# Patient Record
Sex: Male | Born: 1965 | Race: White | Hispanic: No | Marital: Single | Smoking: Never smoker
Health system: Southern US, Community
[De-identification: ages and names within clinical notes are randomized; demographics above are authoritative.]

## PROBLEM LIST (undated history)

## (undated) DIAGNOSIS — F329 Major depressive disorder, single episode, unspecified: Secondary | ICD-10-CM

## (undated) DIAGNOSIS — I10 Essential (primary) hypertension: Secondary | ICD-10-CM

## (undated) DIAGNOSIS — F32A Depression, unspecified: Secondary | ICD-10-CM

## (undated) DIAGNOSIS — R7989 Other specified abnormal findings of blood chemistry: Secondary | ICD-10-CM

## (undated) HISTORY — DX: Essential (primary) hypertension: I10

## (undated) HISTORY — DX: Depression, unspecified: F32.A

## (undated) HISTORY — DX: Major depressive disorder, single episode, unspecified: F32.9

## (undated) HISTORY — DX: Other specified abnormal findings of blood chemistry: R79.89

---

## 2012-05-25 DIAGNOSIS — E291 Testicular hypofunction: Secondary | ICD-10-CM | POA: Insufficient documentation

## 2012-05-25 DIAGNOSIS — Z79899 Other long term (current) drug therapy: Secondary | ICD-10-CM | POA: Insufficient documentation

## 2012-05-25 DIAGNOSIS — N529 Male erectile dysfunction, unspecified: Secondary | ICD-10-CM | POA: Insufficient documentation

## 2012-05-25 DIAGNOSIS — N486 Induration penis plastica: Secondary | ICD-10-CM | POA: Insufficient documentation

## 2012-05-25 DIAGNOSIS — N401 Enlarged prostate with lower urinary tract symptoms: Secondary | ICD-10-CM | POA: Insufficient documentation

## 2012-06-03 ENCOUNTER — Ambulatory Visit: Payer: Self-pay | Admitting: Internal Medicine

## 2012-06-03 LAB — CBC CANCER CENTER
Basophil %: 0.8 %
Eosinophil #: 0.2 x10 3/mm (ref 0.0–0.7)
Eosinophil %: 1.6 %
HGB: 18.3 g/dL — ABNORMAL HIGH (ref 13.0–18.0)
Lymphocyte #: 2.3 x10 3/mm (ref 1.0–3.6)
Lymphocyte %: 24.1 %
MCH: 32.1 pg (ref 26.0–34.0)
MCHC: 33.1 g/dL (ref 32.0–36.0)
Monocyte %: 10.1 %
Neutrophil #: 6.1 x10 3/mm (ref 1.4–6.5)
Neutrophil %: 63.4 %
Platelet: 252 x10 3/mm (ref 150–440)
RBC: 5.72 10*6/uL (ref 4.40–5.90)
RDW: 13.4 % (ref 11.5–14.5)
WBC: 9.6 x10 3/mm (ref 3.8–10.6)

## 2012-06-05 LAB — CANCER CENTER HEMATOCRIT: HCT: 55.6 % — ABNORMAL HIGH (ref 40.0–52.0)

## 2012-06-09 ENCOUNTER — Ambulatory Visit: Payer: Self-pay | Admitting: Internal Medicine

## 2012-06-10 LAB — CANCER CENTER HEMATOCRIT: HCT: 51.3 % (ref 40.0–52.0)

## 2012-06-16 LAB — CANCER CENTER HEMATOCRIT: HCT: 48.9 % (ref 40.0–52.0)

## 2012-06-29 LAB — CANCER CENTER HEMATOCRIT: HCT: 53.3 % — ABNORMAL HIGH (ref 40.0–52.0)

## 2012-07-03 LAB — CANCER CENTER HEMATOCRIT: HCT: 48 % (ref 40.0–52.0)

## 2012-07-06 LAB — CANCER CENTER HEMATOCRIT: HCT: 48.3 % (ref 40.0–52.0)

## 2012-07-10 ENCOUNTER — Ambulatory Visit: Payer: Self-pay | Admitting: Internal Medicine

## 2012-07-17 LAB — CANCER CTR PLATELET CT: Platelet: 293 x10 3/mm (ref 150–440)

## 2012-07-31 LAB — CANCER CENTER HEMATOCRIT: HCT: 49 % (ref 40.0–52.0)

## 2012-08-09 ENCOUNTER — Ambulatory Visit: Payer: Self-pay | Admitting: Internal Medicine

## 2012-09-09 ENCOUNTER — Ambulatory Visit: Payer: Self-pay | Admitting: Internal Medicine

## 2012-09-30 LAB — CANCER CENTER HEMATOCRIT: HCT: 48.5 % (ref 40.0–52.0)

## 2012-10-10 ENCOUNTER — Ambulatory Visit: Payer: Self-pay | Admitting: Internal Medicine

## 2012-11-04 LAB — CANCER CENTER HEMATOCRIT: HCT: 48.9 % (ref 40.0–52.0)

## 2012-11-07 ENCOUNTER — Ambulatory Visit: Payer: Self-pay | Admitting: Internal Medicine

## 2012-11-18 LAB — CANCER CENTER HEMATOCRIT: HCT: 51.2 % (ref 40.0–52.0)

## 2012-12-08 ENCOUNTER — Ambulatory Visit: Payer: Self-pay | Admitting: Internal Medicine

## 2012-12-16 LAB — CBC CANCER CENTER
Basophil #: 0.1 x10 3/mm (ref 0.0–0.1)
Basophil %: 1.4 %
Eosinophil #: 0.1 x10 3/mm (ref 0.0–0.7)
Eosinophil %: 2.3 %
HCT: 48 % (ref 40.0–52.0)
Lymphocyte #: 1.2 x10 3/mm (ref 1.0–3.6)
Lymphocyte %: 22.2 %
MCH: 30.2 pg (ref 26.0–34.0)
MCHC: 33.2 g/dL (ref 32.0–36.0)
MCV: 91 fL (ref 80–100)
Monocyte #: 0.7 x10 3/mm (ref 0.2–1.0)
Monocyte %: 12.2 %
Platelet: 266 x10 3/mm (ref 150–440)
RBC: 5.29 10*6/uL (ref 4.40–5.90)
RDW: 15.8 % — ABNORMAL HIGH (ref 11.5–14.5)

## 2012-12-23 LAB — CANCER CENTER HEMATOCRIT: HCT: 49.1 % (ref 40.0–52.0)

## 2013-01-07 ENCOUNTER — Ambulatory Visit: Payer: Self-pay | Admitting: Internal Medicine

## 2013-01-29 LAB — CANCER CENTER HEMATOCRIT: HCT: 46.2 % (ref 40.0–52.0)

## 2013-02-07 ENCOUNTER — Ambulatory Visit: Payer: Self-pay | Admitting: Internal Medicine

## 2013-02-12 LAB — CANCER CENTER HEMATOCRIT: HCT: 47.7 % (ref 40.0–52.0)

## 2013-02-26 LAB — CBC CANCER CENTER
Basophil %: 1.1 %
Eosinophil %: 3.4 %
HCT: 48.6 % (ref 40.0–52.0)
Lymphocyte #: 2.1 x10 3/mm (ref 1.0–3.6)
MCH: 30.2 pg (ref 26.0–34.0)
Monocyte #: 1 x10 3/mm (ref 0.2–1.0)
Monocyte %: 14.1 %
Neutrophil %: 50.6 %
Platelet: 295 x10 3/mm (ref 150–440)
RDW: 13.7 % (ref 11.5–14.5)

## 2013-03-09 ENCOUNTER — Ambulatory Visit: Payer: Self-pay | Admitting: Internal Medicine

## 2013-03-19 LAB — CANCER CENTER HEMATOCRIT: HCT: 47.9 % (ref 40.0–52.0)

## 2013-03-26 LAB — CANCER CENTER HEMATOCRIT: HCT: 47 % (ref 40.0–52.0)

## 2013-04-08 LAB — CANCER CENTER HEMATOCRIT: HCT: 46.3 % (ref 40.0–52.0)

## 2013-04-09 ENCOUNTER — Ambulatory Visit: Payer: Self-pay | Admitting: Internal Medicine

## 2013-05-10 ENCOUNTER — Ambulatory Visit: Payer: Self-pay | Admitting: Internal Medicine

## 2013-05-27 LAB — CBC CANCER CENTER
Basophil #: 0.1 x10 3/mm (ref 0.0–0.1)
Basophil %: 0.9 %
Eosinophil #: 0.2 x10 3/mm (ref 0.0–0.7)
Eosinophil %: 2.5 %
HCT: 50.7 % (ref 40.0–52.0)
Lymphocyte #: 1.5 x10 3/mm (ref 1.0–3.6)
Lymphocyte %: 22.7 %
MCH: 28.5 pg (ref 26.0–34.0)
MCHC: 32.8 g/dL (ref 32.0–36.0)
MCV: 87 fL (ref 80–100)
Monocyte #: 0.8 x10 3/mm (ref 0.2–1.0)
Neutrophil #: 4.2 x10 3/mm (ref 1.4–6.5)
Neutrophil %: 61.7 %
RBC: 5.83 10*6/uL (ref 4.40–5.90)
WBC: 6.8 x10 3/mm (ref 3.8–10.6)

## 2013-06-09 ENCOUNTER — Ambulatory Visit: Payer: Self-pay | Admitting: Internal Medicine

## 2013-07-10 ENCOUNTER — Ambulatory Visit: Payer: Self-pay | Admitting: Internal Medicine

## 2013-07-19 DIAGNOSIS — D751 Secondary polycythemia: Secondary | ICD-10-CM | POA: Insufficient documentation

## 2013-07-19 DIAGNOSIS — D45 Polycythemia vera: Secondary | ICD-10-CM | POA: Insufficient documentation

## 2013-08-12 ENCOUNTER — Ambulatory Visit: Payer: Self-pay | Admitting: Internal Medicine

## 2013-08-12 LAB — CBC CANCER CENTER
Basophil %: 1 %
Eosinophil #: 0.2 x10 3/mm (ref 0.0–0.7)
Eosinophil %: 3.5 %
HCT: 50.1 % (ref 40.0–52.0)
Lymphocyte #: 1.5 x10 3/mm (ref 1.0–3.6)
MCH: 29.2 pg (ref 26.0–34.0)
MCHC: 32.8 g/dL (ref 32.0–36.0)
MCV: 89 fL (ref 80–100)
Monocyte #: 0.7 x10 3/mm (ref 0.2–1.0)
Monocyte %: 12.7 %
Neutrophil #: 2.8 x10 3/mm (ref 1.4–6.5)
Neutrophil %: 54.1 %
Platelet: 252 x10 3/mm (ref 150–440)
RBC: 5.64 10*6/uL (ref 4.40–5.90)
RDW: 14.9 % — ABNORMAL HIGH (ref 11.5–14.5)
WBC: 5.2 x10 3/mm (ref 3.8–10.6)

## 2013-09-07 DIAGNOSIS — R972 Elevated prostate specific antigen [PSA]: Secondary | ICD-10-CM | POA: Insufficient documentation

## 2013-09-09 ENCOUNTER — Ambulatory Visit: Payer: Self-pay | Admitting: Internal Medicine

## 2013-09-24 ENCOUNTER — Emergency Department: Payer: Self-pay | Admitting: Emergency Medicine

## 2013-09-24 LAB — CBC
HCT: 49.4 % (ref 40.0–52.0)
HGB: 16.7 g/dL (ref 13.0–18.0)
MCH: 29.9 pg (ref 26.0–34.0)
MCHC: 33.8 g/dL (ref 32.0–36.0)
MCV: 88 fL (ref 80–100)
Platelet: 267 10*3/uL (ref 150–440)
RBC: 5.58 10*6/uL (ref 4.40–5.90)
RDW: 15.5 % — ABNORMAL HIGH (ref 11.5–14.5)
WBC: 6.9 10*3/uL (ref 3.8–10.6)

## 2013-09-24 LAB — BASIC METABOLIC PANEL
Anion Gap: 5 — ABNORMAL LOW (ref 7–16)
BUN: 14 mg/dL (ref 7–18)
CHLORIDE: 102 mmol/L (ref 98–107)
Calcium, Total: 8.7 mg/dL (ref 8.5–10.1)
Co2: 27 mmol/L (ref 21–32)
Creatinine: 1.05 mg/dL (ref 0.60–1.30)
Glucose: 109 mg/dL — ABNORMAL HIGH (ref 65–99)
OSMOLALITY: 269 (ref 275–301)
Potassium: 3.8 mmol/L (ref 3.5–5.1)
Sodium: 134 mmol/L — ABNORMAL LOW (ref 136–145)

## 2013-09-24 LAB — TROPONIN I
Troponin-I: <0.02 ng/mL
Troponin-I: <0.02 ng/mL

## 2014-03-01 DIAGNOSIS — F339 Major depressive disorder, recurrent, unspecified: Secondary | ICD-10-CM | POA: Insufficient documentation

## 2014-03-01 DIAGNOSIS — F32A Depression, unspecified: Secondary | ICD-10-CM | POA: Insufficient documentation

## 2014-03-01 DIAGNOSIS — E291 Testicular hypofunction: Secondary | ICD-10-CM | POA: Insufficient documentation

## 2014-03-01 DIAGNOSIS — D45 Polycythemia vera: Secondary | ICD-10-CM | POA: Insufficient documentation

## 2014-03-01 DIAGNOSIS — I10 Essential (primary) hypertension: Secondary | ICD-10-CM | POA: Insufficient documentation

## 2014-03-01 DIAGNOSIS — F329 Major depressive disorder, single episode, unspecified: Secondary | ICD-10-CM

## 2014-03-01 DIAGNOSIS — E785 Hyperlipidemia, unspecified: Secondary | ICD-10-CM | POA: Insufficient documentation

## 2016-05-30 DIAGNOSIS — Z800 Family history of malignant neoplasm of digestive organs: Secondary | ICD-10-CM | POA: Insufficient documentation

## 2017-04-30 ENCOUNTER — Ambulatory Visit (INDEPENDENT_AMBULATORY_CARE_PROVIDER_SITE_OTHER): Payer: Self-pay

## 2017-04-30 ENCOUNTER — Encounter: Payer: Self-pay | Admitting: Podiatry

## 2017-04-30 ENCOUNTER — Ambulatory Visit (INDEPENDENT_AMBULATORY_CARE_PROVIDER_SITE_OTHER): Payer: Self-pay | Admitting: Podiatry

## 2017-04-30 VITALS — BP 155/108 | HR 74 | Temp 98.7°F | Resp 16

## 2017-04-30 DIAGNOSIS — M722 Plantar fascial fibromatosis: Secondary | ICD-10-CM

## 2017-04-30 MED ORDER — METHYLPREDNISOLONE 4 MG PO TBPK: ORAL_TABLET | ORAL | 0 refills | Status: DC

## 2017-04-30 MED ORDER — MELOXICAM 15 MG PO TABS: 15.0000 mg | ORAL_TABLET | Freq: Every day | ORAL | 3 refills | Status: DC

## 2017-04-30 NOTE — Progress Notes (Signed)
   Subjective:    Patient ID: Mark Parsons., male    DOB: July 27, 1966, 51 y.o.   MRN: [id number]  HPI: He presents today chief complaint of pain to his left heel times the past 5 months. He reports that he stepped on a rock and it felt like stone bruise 5 or 6 months ago. He states now it is painful to walk and seems to have gotten worse. He states gets up in the morning just the worst pain of the day. He states this seems to come and go but recently has gotten worse. He tried over-the-counter inserts with his new tennis shoes to no avail.    Review of Systems  Musculoskeletal: Positive for arthralgias and gait problem.  All other systems reviewed and are negative.      Objective:   Physical Exam: Vital signs are stable alert and oriented 3. Pulses are palpable. Neurologic sensorium is intact. Deep tendon reflexes are intact. Muscle strength +5 over 5 dorsiflexes plantar flexors and inverters everters all just musculature is intact. Orthopedic evaluation demonstrates all joints distal to the ankle in full range of motion without crepitation. Cutaneous evaluation of a straight supple well-hydrated cutis he has pain on palpation medial calcaneal tubercle of the left heel. Otherwise joints distal to the ankle in full range of motion without crepitation. U Cheney's evaluation does not demonstrate any type of open lesions. Radiographs taken today demonstrate soft tissue increase in density at the plantar fascia continue insertion site. No other osseous abnormalities are noted.        Assessment & Plan:  Assessment: Plantar fasciitis left.  Plan: Injected the left heel today with Kenalog and local anesthetic starting him on a Medrol Dosepak to be followed by meloxicam. Also recommended the use of a plantar fascial brace and night splint. Discussed appropriate shoe gear stretching exercises ice therapy and shoe gear modifications. I will follow-up with him in 1 month.

## 2017-05-28 ENCOUNTER — Ambulatory Visit: Payer: Self-pay | Admitting: Podiatry

## 2017-09-15 NOTE — Progress Notes (Signed)
Hematology/Oncology Consult note [facility] Telephone:([phone] [phone]   Patient Care Team: [place] Pink, MD as PCP - General (Family Medicine)  REFERRING PROVIDER: Richardo Hanks  Urology from [facility]:  Evaluation of polycythemia polycythemia.    HISTORY OF PRESENTING ILLNESS:  Mark Parsons. is a  52 y.o.  male with PMH listed below who was referred to me for evaluation of polycythemia. Patient has a history of testicular hypofunction (Peyronie's disease)for which he follows with Dr. Jacqlyn Larsen at [facility] urology. He has been on testosterone injections and utilize sildenafil as needed. He has high hemoglobin in the past and was considered to be related to testosterone injection. He has had therapeutic phlebotomy in the past at [facility] or at used oral clinic. He also donates to blood drive and. He was referred to me because his hemoglobin has been high and blood drivedoes not take his donation   patient reports he does not have insurance coverage and he's paying out of pocket. He prefers only the most necessary labs being done today. He defer or other workups until he obtains coverage.    Review of Systems  Constitutional: Negative for chills and fever.  HENT: Negative for hearing loss.   Eyes: Negative for blurred vision and photophobia.  Respiratory: Negative for cough.   Cardiovascular: Negative for chest pain and orthopnea.  Gastrointestinal: Negative for heartburn, nausea and vomiting.  Genitourinary: Negative for dysuria and urgency.  Musculoskeletal: Negative for myalgias.  Skin: Negative for rash.       Facial plethora. .   Neurological: Negative for dizziness and headaches.  Endo/Heme/Allergies: Does not bruise/bleed easily.  Psychiatric/Behavioral: Negative for depression.    MEDICAL HISTORY:  Past Medical History:  Diagnosis Date  . Depression   . Hypertension   . Low testosterone in male      SURGICAL HISTORY: History reviewed. No pertinent surgical history.  SOCIAL HISTORY: Social History   Socioeconomic History  . Marital status: Single    Spouse name: Not on file  . Number of children: Not on file  . Years of education: Not on file  . Highest education level: Not on file  Social Needs  . Financial resource strain: Not on file  . Food insecurity - worry: Not on file  . Food insecurity - inability: Not on file  . Transportation needs - medical: Not on file  . Transportation needs - non-medical: Not on file  Occupational History  . Not on file  Tobacco Use  . Smoking status: Never Smoker  . Smokeless tobacco: Never Used  Substance and Sexual Activity  . Alcohol use: Yes    Alcohol/week: 24.0 oz    Types: 40 Cans of beer per week  . Drug use: No  . Sexual activity: Not Currently  Other Topics Concern  . Not on file  Social History Narrative  . Not on file    FAMILY HISTORY: Family History  Problem Relation Age of Onset  . Colon cancer Mother   . Multiple myeloma Father   . Aortic aneurysm Father   . Stroke Father     ALLERGIES:  has No Known Allergies.  MEDICATIONS:  Current Outpatient Medications  Medication Sig Dispense Refill  . FLUoxetine (PROZAC) 40 MG capsule Take 40 mg by mouth.    Marland Kitchen lisinopril-hydrochlorothiazide (PRINZIDE,ZESTORETIC) 20-25 MG tablet Take by mouth.    . sildenafil (REVATIO) 20 MG tablet TAKE 3 TO 5 TABLETS AS NEEDED    .  TESTOSTERONE IM INJECT 1 ML INTRAMUSCULARLY EVERY 2 WEEKS OR AS INSTRUCTED     No current facility-administered medications for this visit.      PHYSICAL EXAMINATION: ECOG PERFORMANCE STATUS: 0 - Asymptomatic Vitals:   09/16/17 1109  BP: (!) 143/97  Pulse: 80  Temp: (!) 96.9 F (36.1 C)   Filed Weights   09/16/17 1109  Weight: 204 lb 5 oz (92.7 kg)    Physical Exam  Constitutional: He is oriented to person, place, and time and well-developed, well-nourished, and in no distress.  HENT:   Head: Normocephalic and atraumatic.  Mouth/Throat: No oropharyngeal exudate.  Eyes: Conjunctivae and EOM are normal. Pupils are equal, round, and reactive to light. No scleral icterus.  Neck: Normal range of motion. Neck supple.  Cardiovascular: Normal rate and regular rhythm.  Pulmonary/Chest: Effort normal and breath sounds normal. He has no wheezes.  Abdominal: Soft. Bowel sounds are normal. He exhibits no distension.  Musculoskeletal: Normal range of motion. He exhibits no edema.  Lymphadenopathy:    He has no cervical adenopathy.  Neurological: He is alert and oriented to person, place, and time. No cranial nerve deficit.  Skin: Skin is warm and dry.  Psychiatric: Affect and judgment normal.     LABORATORY DATA:  I have reviewed the data as listed Lab Results  Component Value Date   WBC 6.9 09/24/2013   HGB 16.7 09/24/2013   HCT 49.4 09/24/2013   MCV 88 09/24/2013   PLT 267 09/24/2013   No results for input(s): NA, K, CL, CO2, GLUCOSE, BUN, CREATININE, CALCIUM, GFRNONAA, GFRAA, PROT, ALBUMIN, AST, ALT, ALKPHOS, BILITOT, BILIDIR, IBILI in the last 8760 hours.     ASSESSMENT & PLAN:  1. Secondary polycythemia    Discussed with patient that his high hemoglobin count is likely due to testosteron supplements. Therapeutic phlebotomy can be considered to decrease hemoglobin counts. Will schedule phlebotomy weekly x 2 with removal of 545m blood. Check Hemoglobin prior to removal. Hold if hemoglobin <16 or Hct<45..  I suggests comprehensive work up with also checking iron panel and ferritin, as eventually his iron store will be deplete. He declined as he is paying all the tests out of pocket.   Offered him to make follow up appointment with me in 3 weeks. He declined. He prefer to follow up with his urologist and primary care physician to have blood work monitored.    All questions were answered. The patient knows to call the clinic with any problems questions or  concerns.  Return of visit:  Thank you for this kind referral and the opportunity to participate in the care of this patient. A copy of today's note is routed to referring provider    ZEarlie Server MD, PhD Hematology Oncology [facility] LPat AFairview Park HospitalPager- [phone]/03/2018

## 2017-09-16 ENCOUNTER — Inpatient Hospital Stay: Payer: Self-pay

## 2017-09-16 ENCOUNTER — Inpatient Hospital Stay: Payer: Self-pay | Attending: Oncology | Admitting: Oncology

## 2017-09-16 ENCOUNTER — Other Ambulatory Visit: Payer: Self-pay

## 2017-09-16 ENCOUNTER — Encounter: Payer: Self-pay | Admitting: Oncology

## 2017-09-16 VITALS — BP 143/97 | HR 80 | Temp 96.9°F | Ht 70.0 in | Wt 204.3 lb

## 2017-09-16 DIAGNOSIS — E291 Testicular hypofunction: Secondary | ICD-10-CM | POA: Insufficient documentation

## 2017-09-16 DIAGNOSIS — I10 Essential (primary) hypertension: Secondary | ICD-10-CM | POA: Insufficient documentation

## 2017-09-16 DIAGNOSIS — Z800 Family history of malignant neoplasm of digestive organs: Secondary | ICD-10-CM | POA: Insufficient documentation

## 2017-09-16 DIAGNOSIS — D751 Secondary polycythemia: Secondary | ICD-10-CM | POA: Insufficient documentation

## 2017-09-16 DIAGNOSIS — Z79899 Other long term (current) drug therapy: Secondary | ICD-10-CM | POA: Insufficient documentation

## 2017-09-16 DIAGNOSIS — N486 Induration penis plastica: Secondary | ICD-10-CM | POA: Insufficient documentation

## 2017-09-16 LAB — CBC WITH DIFFERENTIAL/PLATELET
Basophils Absolute: 0.1 10*3/uL (ref 0–0.1)
Basophils Relative: 1 %
EOS PCT: 1 %
Eosinophils Absolute: 0.1 10*3/uL (ref 0–0.7)
HCT: 52.6 % — ABNORMAL HIGH (ref 40.0–52.0)
Hemoglobin: 17.8 g/dL (ref 13.0–18.0)
LYMPHS ABS: 1.5 10*3/uL (ref 1.0–3.6)
LYMPHS PCT: 25 %
MCH: 31.2 pg (ref 26.0–34.0)
MCHC: 33.8 g/dL (ref 32.0–36.0)
MCV: 92.4 fL (ref 80.0–100.0)
MONO ABS: 0.8 10*3/uL (ref 0.2–1.0)
Monocytes Relative: 13 %
Neutro Abs: 3.7 10*3/uL (ref 1.4–6.5)
Neutrophils Relative %: 60 %
PLATELETS: 283 10*3/uL (ref 150–440)
RBC: 5.7 MIL/uL (ref 4.40–5.90)
RDW: 13.6 % (ref 11.5–14.5)
WBC: 6.1 10*3/uL (ref 3.8–10.6)

## 2017-09-16 NOTE — Progress Notes (Signed)
Patient here today as a new patient  

## 2017-09-22 ENCOUNTER — Inpatient Hospital Stay: Payer: Self-pay

## 2017-09-22 VITALS — BP 126/86 | HR 81 | Temp 96.1°F | Resp 20

## 2017-09-22 DIAGNOSIS — D751 Secondary polycythemia: Secondary | ICD-10-CM

## 2017-09-29 ENCOUNTER — Inpatient Hospital Stay: Payer: Self-pay

## 2017-10-03 ENCOUNTER — Inpatient Hospital Stay: Payer: Self-pay

## 2017-10-03 ENCOUNTER — Telehealth: Payer: Self-pay | Admitting: *Deleted

## 2017-10-03 VITALS — BP 134/89 | HR 70 | Temp 95.9°F | Resp 19

## 2017-10-03 DIAGNOSIS — D751 Secondary polycythemia: Secondary | ICD-10-CM

## 2017-10-03 LAB — HEMOGLOBIN: Hemoglobin: 16.4 g/dL (ref 13.0–18.0)

## 2017-10-03 NOTE — Telephone Encounter (Signed)
Cancelled 10/07/17 lab/MD appts.  Per Patient Request.  Patient stated that he will call at a later date to Reschedule his appts.

## 2017-10-07 ENCOUNTER — Inpatient Hospital Stay: Payer: Self-pay | Admitting: Oncology

## 2017-10-07 ENCOUNTER — Inpatient Hospital Stay: Payer: Self-pay

## 2017-12-10 ENCOUNTER — Ambulatory Visit: Payer: Self-pay | Admitting: Podiatry

## 2017-12-10 ENCOUNTER — Encounter: Payer: Self-pay | Admitting: Podiatry

## 2017-12-10 DIAGNOSIS — M722 Plantar fascial fibromatosis: Secondary | ICD-10-CM

## 2017-12-10 NOTE — Progress Notes (Signed)
Mark Parsons presents today for follow-up of his left heel pain.  States that is hurting again my low whole leg is starting to hurt he states that he is fell and injured his left hip several years ago and is been bothering him recently.  He is wondering if is the foot to be making the hip hurt.  Objective: Vital signs are stable he is alert and oriented x3.  Pulses are palpable.  Neurologic sensorium is intact.  Deep tendon reflexes are intact.  Muscle strength is normal symmetrical.  He has pain on palpation medial continue to go the left heel.  No pain on medial and lateral compression of the calcaneus.  Assessment: Plantar fasciitis left.  Plan: Discussed etiology pathology conservative versus surgical therapies.  After sterile Betadine skin prep I injected 20 mg of Kenalog 5 mg Marcaine to the point maximal tenderness of left heel.  Provided him with the name and number of orthopedic group to contact regarding his left hip pain.

## 2018-04-13 ENCOUNTER — Encounter: Payer: Self-pay | Admitting: Podiatry

## 2018-04-13 ENCOUNTER — Ambulatory Visit (INDEPENDENT_AMBULATORY_CARE_PROVIDER_SITE_OTHER): Payer: Self-pay | Admitting: Podiatry

## 2018-04-13 DIAGNOSIS — M722 Plantar fascial fibromatosis: Secondary | ICD-10-CM

## 2018-04-13 NOTE — Progress Notes (Signed)
He presents today states that the last shot really did not do that much.  He states that it might be 50% improved.  Objective: Vital signs are stable he is alert and oriented x3.  Pulses are palpable.  Neurologic sensorium is intact.  Degenerative flexors are intact.  He has pain on palpation medial calcaneal tubercle of the left heel.  Assessment: Plantar fasciitis left.  Plan: After sterile Betadine skin prep I injected 20 mg Kenalog 5 mg Marcaine left heel medial aspect of the calcaneus.  Tolerated procedure well without complications follow-up with him as needed.

## 2018-09-03 ENCOUNTER — Emergency Department: Payer: Managed Care, Other (non HMO)

## 2018-09-03 ENCOUNTER — Other Ambulatory Visit: Payer: Self-pay

## 2018-09-03 DIAGNOSIS — Y9389 Activity, other specified: Secondary | ICD-10-CM | POA: Insufficient documentation

## 2018-09-03 DIAGNOSIS — W01190A Fall on same level from slipping, tripping and stumbling with subsequent striking against furniture, initial encounter: Secondary | ICD-10-CM | POA: Insufficient documentation

## 2018-09-03 DIAGNOSIS — Y92000 Kitchen of unspecified non-institutional (private) residence as  the place of occurrence of the external cause: Secondary | ICD-10-CM | POA: Diagnosis not present

## 2018-09-03 DIAGNOSIS — I10 Essential (primary) hypertension: Secondary | ICD-10-CM | POA: Diagnosis not present

## 2018-09-03 DIAGNOSIS — Y998 Other external cause status: Secondary | ICD-10-CM | POA: Diagnosis not present

## 2018-09-03 DIAGNOSIS — Z23 Encounter for immunization: Secondary | ICD-10-CM | POA: Insufficient documentation

## 2018-09-03 DIAGNOSIS — S0181XA Laceration without foreign body of other part of head, initial encounter: Secondary | ICD-10-CM | POA: Insufficient documentation

## 2018-09-03 DIAGNOSIS — Y92009 Unspecified place in unspecified non-institutional (private) residence as the place of occurrence of the external cause: Secondary | ICD-10-CM | POA: Insufficient documentation

## 2018-09-03 DIAGNOSIS — S0990XA Unspecified injury of head, initial encounter: Secondary | ICD-10-CM | POA: Diagnosis present

## 2018-09-03 NOTE — ED Notes (Signed)
Bandage applied at this time by Dorian, EDT.

## 2018-09-03 NOTE — ED Triage Notes (Signed)
Pt arrives to ED via POV from home s/p fall with head injury r/t possible ETOH intoxication. Pt reports trip and fall, no LOC. Pt has a 3" lac to the forehead, no bleeding at this time. No neck pain, no visual changes. Pt admits to "4 beers", denies drug use.

## 2018-09-04 ENCOUNTER — Emergency Department
Admission: EM | Admit: 2018-09-04 | Discharge: 2018-09-04 | Disposition: A | Discharge status: Home / Self Care | Payer: Managed Care, Other (non HMO) | Attending: Emergency Medicine | Admitting: Emergency Medicine

## 2018-09-04 DIAGNOSIS — S0181XA Laceration without foreign body of other part of head, initial encounter: Secondary | ICD-10-CM

## 2018-09-04 DIAGNOSIS — S0990XA Unspecified injury of head, initial encounter: Secondary | ICD-10-CM

## 2018-09-04 DIAGNOSIS — W19XXXA Unspecified fall, initial encounter: Secondary | ICD-10-CM

## 2018-09-04 MED ORDER — TETANUS-DIPHTH-ACELL PERTUSSIS 5-2.5-18.5 LF-MCG/0.5 IM SUSP
INTRAMUSCULAR | Status: AC
  Filled 2018-09-04: qty 0.5

## 2018-09-04 MED ORDER — LIDOCAINE HCL (PF) 1 % IJ SOLN
INTRAMUSCULAR | Status: AC
  Filled 2018-09-04: qty 5

## 2018-09-04 MED ORDER — ONDANSETRON 4 MG PO TBDP: 4.0000 mg | ORAL_TABLET | Freq: Three times a day (TID) | ORAL | 0 refills | Status: DC | PRN

## 2018-09-04 MED ORDER — TETANUS-DIPHTH-ACELL PERTUSSIS 5-2.5-18.5 LF-MCG/0.5 IM SUSP
0.5000 mL | Freq: Once | INTRAMUSCULAR | Status: AC
  Administered 2018-09-04: 0.5 mL via INTRAMUSCULAR

## 2018-09-04 NOTE — Discharge Instructions (Signed)
1.  Suture removal in 5 days. 2.  Your tetanus has been updated and will be good for 10 years. 3.  You may take Zofran as needed for nausea/vomiting. 4.  You may take Tylenol as needed for pain. 5.  Return to the ER for worsening symptoms, persistent vomiting, difficulty breathing, lethargy or other concerns.

## 2018-09-04 NOTE — ED Notes (Signed)
Pt has laceration to forehead after falling and hitting a wood table.  No loc  No vomiting.  Bleeding controlled .Marland Kitchen  Pt alert   Speech clear.

## 2018-09-04 NOTE — ED Provider Notes (Signed)
[facility] Emergency Department Provider Note   ____________________________________________   First MD Initiated Contact with Patient 09/04/18 0126     (approximate)  I have reviewed the triage vital signs and the nursing notes.   HISTORY  Chief Complaint Fall and Head Injury    HPI Mark Parsons. is a 52 y.o. male who presents to the ED from home status post mechanical fall with head injury.  Patient reports he was drinking alcohol, lost his balance and fell forward, striking his forehead against a wooden table.  Denies LOC.  Denies neck pain, vision changes, chest pain, shortness of breath, abdominal pain, nausea, vomiting or dizziness.  Denies anticoagulant use.  Tetanus is not up-to-date.   Past Medical History:  Diagnosis Date  . Depression   . Hypertension   . Low testosterone in male     Patient Active Problem List   Diagnosis Date Noted  . Polycythemia 09/16/2017  . Family history of colon cancer 05/30/2016  . Depressive disorder, not elsewhere classified 03/01/2014  . Benign essential hypertension 03/01/2014  . Other and unspecified hyperlipidemia 03/01/2014  . Other testicular hypofunction 03/01/2014  . Polycythemia vera ([place]) 03/01/2014  . Elevated prostate specific antigen (PSA) 09/07/2013  . Polycythemia vera, acquired 07/19/2013  . Encounter for long-term (current) use of medications 05/25/2012  . Enlarged prostate with lower urinary tract symptoms (LUTS) 05/25/2012  . ED (erectile dysfunction) of organic origin 05/25/2012  . Peyronie's disease 05/25/2012  . Testicular hypofunction 05/25/2012    History reviewed. No pertinent surgical history.  Prior to Admission medications   Medication Sig Start Date End Date Taking? Authorizing Provider  FLUoxetine (PROZAC) 40 MG capsule Take 40 mg by mouth.    [provider]  lisinopril-hydrochlorothiazide (PRINZIDE,ZESTORETIC) 20-25 MG tablet Take by mouth. 02/10/17 02/10/18   [provider]  ondansetron (ZOFRAN ODT) 4 MG disintegrating tablet Take 1 tablet (4 mg total) by mouth every 8 (eight) hours as needed for nausea or vomiting. 09/04/18   Paulette Blanch, MD  sildenafil (REVATIO) 20 MG tablet TAKE 3 TO 5 TABLETS AS NEEDED 02/20/16   [provider]  TESTOSTERONE IM INJECT 1 ML INTRAMUSCULARLY EVERY 2 WEEKS OR AS INSTRUCTED 08/23/16   [provider]    Allergies Patient has no known allergies.  Family History  Problem Relation Age of Onset  . Colon cancer Mother   . Multiple myeloma Father   . Aortic aneurysm Father   . Stroke Father     Social History Social History   Tobacco Use  . Smoking status: Never Smoker  . Smokeless tobacco: Never Used  Substance Use Topics  . Alcohol use: Yes    Alcohol/week: 40.0 standard drinks    Types: 40 Cans of beer per week  . Drug use: No    Review of Systems  Constitutional: No fever/chills Eyes: No visual changes. ENT: No sore throat. Cardiovascular: Denies chest pain. Respiratory: Denies shortness of breath. Gastrointestinal: No abdominal pain.  No nausea, no vomiting.  No diarrhea.  No constipation. Genitourinary: Negative for dysuria. Musculoskeletal: Negative for back pain. Skin: Negative for rash. Neurological: Positive for head injury.  Negative for headaches, focal weakness or numbness.   ____________________________________________   PHYSICAL EXAM:  VITAL SIGNS: ED Triage Vitals  Enc Vitals Group     BP 09/03/18 2244 (!) 168/100     Pulse Rate 09/03/18 2244 (!) 125     Resp 09/03/18 2244 18     Temp  09/03/18 2244 98.1 F (36.7 C)     Temp Source 09/03/18 2244 Oral     SpO2 09/03/18 2244 96 %     Weight 09/03/18 2245 195 lb (88.5 kg)     Height 09/03/18 2245 '5\' 10"'  (1.778 m)     Head Circumference --      Peak Flow --      Pain Score 09/03/18 2245 1     Pain Loc --      Pain Edu? --      Excl. in [place]? --     Constitutional: Alert and oriented. Well  appearing and in no acute distress. Eyes: Conjunctivae are normal. PERRL. EOMI. Head: Approximately 3 cm horizontally linear laceration to center of forehead without active bleeding.  Well approximated.  Abrasion at vertex of scalp.  Forehead hematoma. Nose: Atraumatic. Mouth/Throat: Mucous membranes are moist.  No dental malocclusion. Neck: No stridor.  No cervical spine tenderness to palpation. Cardiovascular: Normal rate, regular rhythm. Grossly normal heart sounds.  Good peripheral circulation. Respiratory: Normal respiratory effort.  No retractions. Lungs CTAB. Gastrointestinal: Soft and nontender. No distention. No abdominal bruits. No CVA tenderness. Musculoskeletal: No lower extremity tenderness nor edema.  No joint effusions. Neurologic:  Normal speech and language. No gross focal neurologic deficits are appreciated. No gait instability. Skin:  Skin is warm, dry and intact. No rash noted. Psychiatric: Mood and affect are normal. Speech and behavior are normal.  ____________________________________________   LABS (all labs ordered are listed, but only abnormal results are displayed)  Labs Reviewed - No data to display ____________________________________________  EKG  None ____________________________________________  RADIOLOGY  ED MD interpretation: No ICH/no cervical spine injury/no maxillofacial injury  Official radiology report(s): Ct Head Wo Contrast  Result Date: 09/03/2018 CLINICAL DATA:  Status post fall, with head injury. Concern for cervical spine injury. Laceration at the forehead. Initial encounter. EXAM: CT HEAD WITHOUT CONTRAST CT MAXILLOFACIAL WITHOUT CONTRAST CT CERVICAL SPINE WITHOUT CONTRAST TECHNIQUE: Multidetector CT imaging of the head, cervical spine, and maxillofacial structures were performed using the standard protocol without intravenous contrast. Multiplanar CT image reconstructions of the cervical spine and maxillofacial structures were also  generated. COMPARISON:  None. FINDINGS: CT HEAD FINDINGS Brain: No evidence of acute infarction, hemorrhage, hydrocephalus, extra-axial collection or mass lesion/mass effect. The posterior fossa, including the cerebellum, brainstem and fourth ventricle, is within normal limits. The third and lateral ventricles, and basal ganglia are unremarkable in appearance. The cerebral hemispheres are symmetric in appearance, with normal gray-white differentiation. No mass effect or midline shift is seen. Vascular: No hyperdense vessel or unexpected calcification. Skull: There is no evidence of fracture; visualized osseous structures are unremarkable in appearance. Other: Soft tissue swelling is noted overlying the frontal calvarium, with associated laceration. CT MAXILLOFACIAL FINDINGS Osseous: There is no evidence of fracture or dislocation. The maxilla and mandible appear intact. The nasal bone is unremarkable in appearance. The visualized dentition demonstrates no acute abnormality. Orbits: The orbits are intact bilaterally. Sinuses: The visualized paranasal sinuses and mastoid air cells are well-aerated. Soft tissues: No significant soft tissue abnormalities are seen. The parapharyngeal fat planes are preserved. The nasopharynx, oropharynx and hypopharynx are unremarkable in appearance. The visualized portions of the valleculae and piriform sinuses are grossly unremarkable. The parotid and submandibular glands are within normal limits. No cervical lymphadenopathy is seen. CT CERVICAL SPINE FINDINGS Alignment: Normal. Skull base and vertebrae: No acute fracture. No primary bone lesion or focal pathologic process. Soft tissues and spinal canal: No prevertebral fluid  or swelling. No visible canal hematoma. Disc levels: Intervertebral disc spaces are preserved. The bony foramina are grossly unremarkable. Upper chest: The visualized lung apices are clear. The thyroid gland is unremarkable. Other: No additional soft tissue  abnormalities are seen. IMPRESSION: 1. No evidence of traumatic intracranial injury or fracture. 2. No evidence of fracture or dislocation with regard to the maxillofacial structures. 3. No evidence of fracture or subluxation along the cervical spine. 4. Soft tissue swelling overlying the frontal calvarium, with associated laceration. Electronically Signed   By: Garald Balding M.D.   On: 09/03/2018 23:24   Ct Cervical Spine Wo Contrast  Result Date: 09/03/2018 CLINICAL DATA:  Status post fall, with head injury. Concern for cervical spine injury. Laceration at the forehead. Initial encounter. EXAM: CT HEAD WITHOUT CONTRAST CT MAXILLOFACIAL WITHOUT CONTRAST CT CERVICAL SPINE WITHOUT CONTRAST TECHNIQUE: Multidetector CT imaging of the head, cervical spine, and maxillofacial structures were performed using the standard protocol without intravenous contrast. Multiplanar CT image reconstructions of the cervical spine and maxillofacial structures were also generated. COMPARISON:  None. FINDINGS: CT HEAD FINDINGS Brain: No evidence of acute infarction, hemorrhage, hydrocephalus, extra-axial collection or mass lesion/mass effect. The posterior fossa, including the cerebellum, brainstem and fourth ventricle, is within normal limits. The third and lateral ventricles, and basal ganglia are unremarkable in appearance. The cerebral hemispheres are symmetric in appearance, with normal gray-white differentiation. No mass effect or midline shift is seen. Vascular: No hyperdense vessel or unexpected calcification. Skull: There is no evidence of fracture; visualized osseous structures are unremarkable in appearance. Other: Soft tissue swelling is noted overlying the frontal calvarium, with associated laceration. CT MAXILLOFACIAL FINDINGS Osseous: There is no evidence of fracture or dislocation. The maxilla and mandible appear intact. The nasal bone is unremarkable in appearance. The visualized dentition demonstrates no acute  abnormality. Orbits: The orbits are intact bilaterally. Sinuses: The visualized paranasal sinuses and mastoid air cells are well-aerated. Soft tissues: No significant soft tissue abnormalities are seen. The parapharyngeal fat planes are preserved. The nasopharynx, oropharynx and hypopharynx are unremarkable in appearance. The visualized portions of the valleculae and piriform sinuses are grossly unremarkable. The parotid and submandibular glands are within normal limits. No cervical lymphadenopathy is seen. CT CERVICAL SPINE FINDINGS Alignment: Normal. Skull base and vertebrae: No acute fracture. No primary bone lesion or focal pathologic process. Soft tissues and spinal canal: No prevertebral fluid or swelling. No visible canal hematoma. Disc levels: Intervertebral disc spaces are preserved. The bony foramina are grossly unremarkable. Upper chest: The visualized lung apices are clear. The thyroid gland is unremarkable. Other: No additional soft tissue abnormalities are seen. IMPRESSION: 1. No evidence of traumatic intracranial injury or fracture. 2. No evidence of fracture or dislocation with regard to the maxillofacial structures. 3. No evidence of fracture or subluxation along the cervical spine. 4. Soft tissue swelling overlying the frontal calvarium, with associated laceration. Electronically Signed   By: Garald Balding M.D.   On: 09/03/2018 23:24   Ct Maxillofacial Wo Contrast  Result Date: 09/03/2018 CLINICAL DATA:  Status post fall, with head injury. Concern for cervical spine injury. Laceration at the forehead. Initial encounter. EXAM: CT HEAD WITHOUT CONTRAST CT MAXILLOFACIAL WITHOUT CONTRAST CT CERVICAL SPINE WITHOUT CONTRAST TECHNIQUE: Multidetector CT imaging of the head, cervical spine, and maxillofacial structures were performed using the standard protocol without intravenous contrast. Multiplanar CT image reconstructions of the cervical spine and maxillofacial structures were also generated.  COMPARISON:  None. FINDINGS: CT HEAD FINDINGS Brain: No evidence of acute  infarction, hemorrhage, hydrocephalus, extra-axial collection or mass lesion/mass effect. The posterior fossa, including the cerebellum, brainstem and fourth ventricle, is within normal limits. The third and lateral ventricles, and basal ganglia are unremarkable in appearance. The cerebral hemispheres are symmetric in appearance, with normal gray-white differentiation. No mass effect or midline shift is seen. Vascular: No hyperdense vessel or unexpected calcification. Skull: There is no evidence of fracture; visualized osseous structures are unremarkable in appearance. Other: Soft tissue swelling is noted overlying the frontal calvarium, with associated laceration. CT MAXILLOFACIAL FINDINGS Osseous: There is no evidence of fracture or dislocation. The maxilla and mandible appear intact. The nasal bone is unremarkable in appearance. The visualized dentition demonstrates no acute abnormality. Orbits: The orbits are intact bilaterally. Sinuses: The visualized paranasal sinuses and mastoid air cells are well-aerated. Soft tissues: No significant soft tissue abnormalities are seen. The parapharyngeal fat planes are preserved. The nasopharynx, oropharynx and hypopharynx are unremarkable in appearance. The visualized portions of the valleculae and piriform sinuses are grossly unremarkable. The parotid and submandibular glands are within normal limits. No cervical lymphadenopathy is seen. CT CERVICAL SPINE FINDINGS Alignment: Normal. Skull base and vertebrae: No acute fracture. No primary bone lesion or focal pathologic process. Soft tissues and spinal canal: No prevertebral fluid or swelling. No visible canal hematoma. Disc levels: Intervertebral disc spaces are preserved. The bony foramina are grossly unremarkable. Upper chest: The visualized lung apices are clear. The thyroid gland is unremarkable. Other: No additional soft tissue abnormalities are  seen. IMPRESSION: 1. No evidence of traumatic intracranial injury or fracture. 2. No evidence of fracture or dislocation with regard to the maxillofacial structures. 3. No evidence of fracture or subluxation along the cervical spine. 4. Soft tissue swelling overlying the frontal calvarium, with associated laceration. Electronically Signed   By: Garald Balding M.D.   On: 09/03/2018 23:24    ____________________________________________   PROCEDURES  Procedure(s) performed:    Marland KitchenMarland KitchenLaceration Repair Date/Time: 09/04/2018 1:48 AM Performed by: Paulette Blanch, MD Authorized by: Paulette Blanch, MD   Consent:    Consent obtained:  Verbal   Consent given by:  Patient   Risks discussed:  Infection, pain, poor cosmetic result and poor wound healing Anesthesia (see MAR for exact dosages):    Anesthesia method:  Local infiltration   Local anesthetic:  Lidocaine 1% w/o epi Laceration details:    Location:  Face   Face location:  Forehead   Length (cm):  3   Depth (mm):  3 Repair type:    Repair type:  Simple Pre-procedure details:    Preparation:  Patient was prepped and draped in usual sterile fashion Exploration:    Hemostasis achieved with:  Direct pressure   Wound exploration: entire depth of wound probed and visualized     Contaminated: no   Treatment:    Area cleansed with:  Saline   Amount of cleaning:  Standard   Irrigation solution:  Sterile saline   Irrigation method:  Pressure wash   Visualized foreign bodies/material removed: no   Skin repair:    Repair method:  Sutures   Suture size:  5-0   Suture material:  Nylon   Suture technique:  Simple interrupted   Number of sutures:  5 Approximation:    Approximation:  Close Post-procedure details:    Dressing:  Sterile dressing   Patient tolerance of procedure:  Tolerated well, no immediate complications    Critical Care performed: No  ____________________________________________   INITIAL IMPRESSION / ASSESSMENT AND  PLAN / ED COURSE  As part of my medical decision making, I reviewed the following data within the [place] History obtained from family, Nursing notes reviewed and incorporated, Old chart reviewed, Radiograph reviewed and Notes from prior ED visits   52 year old male who presents status post mechanical fall with facial laceration and minor head injury.  CT scans negative for acute traumatic injuries.  Patient tolerated suture repair well.  Tetanus updated.  Strict return precautions given.  Patient and his father verbalize understanding and agree with plan of care.      ____________________________________________   FINAL CLINICAL IMPRESSION(S) / ED DIAGNOSES  Final diagnoses:  Fall, initial encounter  Injury of head, initial encounter  Facial laceration, initial encounter     ED Discharge Orders         Ordered    ondansetron (ZOFRAN ODT) 4 MG disintegrating tablet  Every 8 hours PRN     09/04/18 0144           Note:  This document was prepared using Dragon voice recognition software and may include unintentional dictation errors.    Paulette Blanch, MD 09/04/18 [phone]

## 2018-11-09 ENCOUNTER — Telehealth: Payer: Self-pay

## 2018-11-09 NOTE — Telephone Encounter (Signed)
Copied from [place] [id number]. Topic: Appointment Scheduling - New Patient >> Nov 09, 2018  1:25 PM Alanda Slim E wrote: New patient would like to be scheduled for your office. [name] is the son of your current Pt Mark Parsons and was referred by his father.  Provider: Dr. Caryl Bis >> Nov 09, 2018  1:27 PM Alanda Slim E wrote: Or Dr. Derrel Nip

## 2018-11-10 NOTE — Telephone Encounter (Signed)
Sent to PCP for approval to see/ accept  new patient.

## 2018-11-15 NOTE — Telephone Encounter (Signed)
I am fine taking him on as a new patient, though he should be aware that there may be a wait for a new patient visit and it could potentially be several months. If he ok with this please get him scheduled for the next available new patient visit. Thanks.

## 2018-11-16 NOTE — Telephone Encounter (Signed)
Called and spoke with new pt's father. Father advised and voiced understanding will inform his son and have him call us back.

## 2018-12-08 ENCOUNTER — Ambulatory Visit: Payer: Managed Care, Other (non HMO) | Admitting: Family Medicine

## 2019-01-15 ENCOUNTER — Other Ambulatory Visit: Payer: Self-pay

## 2019-01-15 NOTE — Telephone Encounter (Signed)
This patient has not established care with me yet. He needs to request a refill from his current PCP and once he establishes care I can send the medication in for him.

## 2019-01-15 NOTE — Telephone Encounter (Signed)
I am fine taking him on as a new patient, though he should be aware that there may be a wait for a new patient visit and it could potentially be several months. If he ok with this please get him scheduled for the next available new patient visit. Thanks.   Pt has not been seen by Caryl Bis has an appt scheduled on 02/03/2019   RF request from [facility] for Lisinop/HCTZ 20-25 MG   Sent to PCP to advise

## 2019-01-29 NOTE — Telephone Encounter (Signed)
Sent to PCP please advise.  

## 2019-01-29 NOTE — Telephone Encounter (Signed)
Dr Caryl Bis said yes to taking this pt as a NP. Does Dr Caryl Bis want to see this NP next week? Or does he want me to resch?  Please advise? Thank you!

## 2019-02-02 ENCOUNTER — Other Ambulatory Visit: Payer: Self-pay

## 2019-02-03 ENCOUNTER — Encounter: Payer: Self-pay | Admitting: Family Medicine

## 2019-02-03 ENCOUNTER — Ambulatory Visit: Payer: Self-pay | Admitting: Family Medicine

## 2019-02-03 VITALS — BP 130/98 | HR 99 | Temp 98.6°F | Ht 70.08 in | Wt 200.4 lb

## 2019-02-03 DIAGNOSIS — E785 Hyperlipidemia, unspecified: Secondary | ICD-10-CM

## 2019-02-03 DIAGNOSIS — F1010 Alcohol abuse, uncomplicated: Secondary | ICD-10-CM

## 2019-02-03 DIAGNOSIS — E291 Testicular hypofunction: Secondary | ICD-10-CM

## 2019-02-03 DIAGNOSIS — N486 Induration penis plastica: Secondary | ICD-10-CM

## 2019-02-03 DIAGNOSIS — I10 Essential (primary) hypertension: Secondary | ICD-10-CM

## 2019-02-03 DIAGNOSIS — F339 Major depressive disorder, recurrent, unspecified: Secondary | ICD-10-CM

## 2019-02-03 LAB — COMPREHENSIVE METABOLIC PANEL
ALT: 24 U/L (ref 0–53)
AST: 19 U/L (ref 0–37)
Albumin: 4.1 g/dL (ref 3.5–5.2)
Alkaline Phosphatase: 58 U/L (ref 39–117)
BUN: 12 mg/dL (ref 6–23)
CO2: 31 mEq/L (ref 19–32)
Calcium: 9.2 mg/dL (ref 8.4–10.5)
Chloride: 95 mEq/L — ABNORMAL LOW (ref 96–112)
Creatinine, Ser: 0.99 mg/dL (ref 0.40–1.50)
GFR: 79.44 mL/min (ref >60.00)
Glucose, Bld: 113 mg/dL — ABNORMAL HIGH (ref 70–99)
Potassium: 3.9 mEq/L (ref 3.5–5.1)
Sodium: 133 mEq/L — ABNORMAL LOW (ref 135–145)
Total Bilirubin: 0.4 mg/dL (ref 0.2–1.2)
Total Protein: 7.5 g/dL (ref 6.0–8.3)

## 2019-02-03 LAB — CBC
HCT: 50.8 % (ref 39.0–52.0)
Hemoglobin: 17.2 g/dL — ABNORMAL HIGH (ref 13.0–17.0)
MCHC: 33.8 g/dL (ref 30.0–36.0)
MCV: 86.8 fl (ref 78.0–100.0)
Platelets: 283 10*3/uL (ref 150.0–400.0)
RBC: 5.86 Mil/uL — ABNORMAL HIGH (ref 4.22–5.81)
RDW: 16.5 % — ABNORMAL HIGH (ref 11.5–15.5)
WBC: 6.6 10*3/uL (ref 4.0–10.5)

## 2019-02-03 LAB — LIPID PANEL
Cholesterol: 225 mg/dL — ABNORMAL HIGH (ref 0–200)
HDL: 54.5 mg/dL (ref >39.00)
LDL Cholesterol: 149 mg/dL — ABNORMAL HIGH (ref 0–99)
NonHDL: 170.1
Total CHOL/HDL Ratio: 4
Triglycerides: 104 mg/dL (ref 0.0–149.0)
VLDL: 20.8 mg/dL (ref 0.0–40.0)

## 2019-02-03 LAB — HEMOGLOBIN A1C: Hgb A1c MFr Bld: 6 % (ref 4.6–6.5)

## 2019-02-03 NOTE — Patient Instructions (Signed)
Nice to meet you. Get lab work today and contact you with the results. Please consistently check your blood pressure.  We will have you do a follow-up in 2 months for your blood pressure. Please consider decreasing your alcohol intake.  Please do not stop drinking alcohol all of the sudden as there is a risk for withdrawal and death.  If you would like to see somebody for alcohol treatment please let us know.  If you cut down on her own please do this very slowly.

## 2019-02-03 NOTE — Progress Notes (Signed)
Mark Rumps, MD Phone: [phone]  Mark Parsons. is a 53 y.o. male who presents today for new patient visit.   HYPERTENSION  Disease Monitoring  Home BP Monitoring typically 130's/80s Chest pain- no    Dyspnea- no Medications  Compliance-  Taking lisinopril/hctz.   Edema- no Patient has had issues with hypertension since his early 39s.  He does report a family history of hypertension.  Depression: Patient notes this has been adequately controlled on Prozac.  No significant symptoms.  He has battled depression for many years.  No SI or HI.  Testosterone deficiency/Peyronie's disease: Patient was following with urology for this.  He has been giving himself testosterone injections for this.  He was also following with urology for Peyronie's disease.  He needs a referral to a new urologist.  Cough: He notes he had symptoms starting in December with chills at that time and some wheezing.  He notes he was evaluated in urgent care and received 2 courses of prednisone.  He notes his symptoms have significantly improved and this is not bothering him currently.  No history of smoking.  No asthma history or COPD history.  No travel.  No COVID-19 exposure.  No fevers.  Excessive alcohol use: Patient notes he has 4-6 beers nightly.  He has been drinking that amount of beer for years.  He notes there is a relative amount of stress as he is caring for his father.  Active Ambulatory Problems    Diagnosis Date Noted  . Depression, recurrent ([place]) 03/01/2014  . Elevated prostate specific antigen (PSA) 09/07/2013  . Encounter for long-term (current) use of medications 05/25/2012  . Enlarged prostate with lower urinary tract symptoms (LUTS) 05/25/2012  . Benign essential hypertension 03/01/2014  . Family history of colon cancer 05/30/2016  . ED (erectile dysfunction) of organic origin 05/25/2012  . HLD (hyperlipidemia) 03/01/2014  . Other testicular hypofunction 03/01/2014  . Peyronie's  disease 05/25/2012  . Polycythemia vera ([place]) 03/01/2014  . Polycythemia vera, acquired 07/19/2013  . Testicular hypofunction 05/25/2012  . Polycythemia 09/16/2017  . Excessive drinking of alcohol 02/04/2019   Resolved Ambulatory Problems    Diagnosis Date Noted  . No Resolved Ambulatory Problems   Past Medical History:  Diagnosis Date  . Depression   . Hypertension   . Low testosterone in male     Family History  Problem Relation Age of Onset  . Colon cancer Mother   . Multiple myeloma Father   . Aortic aneurysm Father   . Stroke Father     Social History   Socioeconomic History  . Marital status: Single    Spouse name: Not on file  . Number of children: Not on file  . Years of education: Not on file  . Highest education level: Not on file  Occupational History  . Not on file  Social Needs  . Financial resource strain: Not on file  . Food insecurity:    Worry: Not on file    Inability: Not on file  . Transportation needs:    Medical: Not on file    Non-medical: Not on file  Tobacco Use  . Smoking status: Never Smoker  . Smokeless tobacco: Never Used  Substance and Sexual Activity  . Alcohol use: Yes    Alcohol/week: 40.0 standard drinks    Types: 40 Cans of beer per week  . Drug use: No  . Sexual activity: Not Currently  Lifestyle  . Physical activity:    Days per  week: Not on file    Minutes per session: Not on file  . Stress: Not on file  Relationships  . Social connections:    Talks on phone: Not on file    Gets together: Not on file    Attends religious service: Not on file    Active member of club or organization: Not on file    Attends meetings of clubs or organizations: Not on file    Relationship status: Not on file  . Intimate partner violence:    Fear of current or ex partner: Not on file    Emotionally abused: Not on file    Physically abused: Not on file    Forced sexual activity: Not on file  Other Topics Concern  . Not on file   Social History Narrative  . Not on file    ROS  General:  Negative for nexplained weight loss, fever Skin: Negative for new or changing mole, sore that won't heal HEENT: Negative for trouble hearing, trouble seeing, ringing in ears, mouth sores, hoarseness, change in voice, dysphagia. CV:  Negative for chest pain, dyspnea, edema, palpitations Resp: Positive for cough (improved after starting in December), negative for dyspnea, hemoptysis GI: Negative for nausea, vomiting, diarrhea, constipation, abdominal pain, melena, hematochezia. GU: Negative for dysuria, incontinence, urinary hesitance, hematuria, vaginal or penile discharge, polyuria, sexual difficulty, lumps in testicle or breasts MSK: Negative for muscle cramps or aches, joint pain or swelling Neuro: Negative for headaches, weakness, numbness, dizziness, passing out/fainting Psych: Negative for depression, anxiety, memory problems  Objective  Physical Exam Vitals:   02/03/19 1140 02/03/19 1210  BP: (!) 132/98 (!) 130/98  Pulse: 96 99  Temp: 98.6 F (37 C)   SpO2: 96%     BP Readings from Last 3 Encounters:  02/03/19 (!) 130/98  09/04/18 119/69  10/03/17 134/89   Wt Readings from Last 3 Encounters:  02/03/19 200 lb 6.4 oz (90.9 kg)  09/03/18 195 lb (88.5 kg)  09/16/17 204 lb 5 oz (92.7 kg)    Physical Exam Constitutional:      General: He is not in acute distress.    Appearance: He is not diaphoretic.  HENT:     Head: Normocephalic and atraumatic.     Mouth/Throat:     Mouth: Mucous membranes are moist.     Pharynx: No oropharyngeal exudate.  Eyes:     Conjunctiva/sclera: Conjunctivae normal.     Pupils: Pupils are equal, round, and reactive to light.  Cardiovascular:     Rate and Rhythm: Normal rate and regular rhythm.     Heart sounds: Normal heart sounds.  Pulmonary:     Effort: Pulmonary effort is normal.     Breath sounds: Normal breath sounds.  Abdominal:     General: Bowel sounds are normal.  There is no distension.     Palpations: Abdomen is soft. There is no mass.     Tenderness: There is no abdominal tenderness.  Genitourinary:    Comments: Peyronie's lesion noted at the proximal shaft of the patient's penis, no abnormal shape to the penis, normal scrotum, normal testicles Musculoskeletal:     Right lower leg: No edema.     Left lower leg: No edema.  Lymphadenopathy:     Cervical: No cervical adenopathy.  Skin:    General: Skin is warm and dry.  Neurological:     Mental Status: He is alert.  Psychiatric:        Mood and Affect: Mood normal.  Behavior: Behavior normal.      Assessment/Plan:   Benign essential hypertension Above goal.  We will get lab work today and then determine appropriateness of changing blood pressure regimen.  The patient is self-pay and I did have our front office staff confirm potential cost for his lab work.  I discussed with the patient that this was an approximation of the possible cost and that the labs through our lab would be 55% off of the potential cost.  I advised that the cost could be higher or lower.  He verbalized understanding.  Testicular hypofunction Refer to urology.  I discussed with the patient that I do not prescribe testosterone supplementation and that this would need to be managed through urology.  Peyronie's disease Refer back to urology.  Depression, recurrent ([place]) Well-controlled on Prozac.  Continue Prozac.  Excessive drinking of alcohol I discussed that the patient's current alcohol intake is excessive.  I advised that he decrease his alcohol intake and offered to provide him with contact information for a specialist that can help him with this.  He declined this at this time.  I advised that if he is going to decrease his alcohol intake on his own that he do it very slowly.  I advised that he not discontinue alcohol intake all at once as there is risk of withdrawal and death.  I discussed the potential  adverse effects of chronically excessive use of alcohol.   Orders Placed This Encounter  Procedures  . Comp Met (CMET)  . Lipid panel  . HgB A1c  . CBC  . Ambulatory referral to Urology    Referral Priority:   Routine    Referral Type:   Consultation    Referral Reason:   Specialty Services Required    Requested Specialty:   Urology    Number of Visits Requested:   1    Meds ordered this encounter  Medications  . FLUoxetine (PROZAC) 40 MG capsule    Sig: Take 1 capsule (40 mg total) by mouth daily.    Dispense:  90 capsule    Refill:  [place], MD [place]

## 2019-02-04 DIAGNOSIS — F1010 Alcohol abuse, uncomplicated: Secondary | ICD-10-CM | POA: Insufficient documentation

## 2019-02-04 MED ORDER — FLUOXETINE HCL 40 MG PO CAPS: 40.0000 mg | ORAL_CAPSULE | Freq: Every day | ORAL | 3 refills | Status: DC

## 2019-02-04 NOTE — Assessment & Plan Note (Signed)
Well-controlled on Prozac.  Continue Prozac.

## 2019-02-04 NOTE — Assessment & Plan Note (Signed)
I discussed that the patient's current alcohol intake is excessive.  I advised that he decrease his alcohol intake and offered to provide him with contact information for a specialist that can help him with this.  He declined this at this time.  I advised that if he is going to decrease his alcohol intake on his own that he do it very slowly.  I advised that he not discontinue alcohol intake all at once as there is risk of withdrawal and death.  I discussed the potential adverse effects of chronically excessive use of alcohol.

## 2019-02-04 NOTE — Assessment & Plan Note (Signed)
Refer to urology.  I discussed with the patient that I do not prescribe testosterone supplementation and that this would need to be managed through urology.

## 2019-02-04 NOTE — Assessment & Plan Note (Signed)
Refer back to urology 

## 2019-02-04 NOTE — Assessment & Plan Note (Signed)
Above goal.  We will get lab work today and then determine appropriateness of changing blood pressure regimen.  The patient is self-pay and I did have our front office staff confirm potential cost for his lab work.  I discussed with the patient that this was an approximation of the possible cost and that the labs through our lab would be 55% off of the potential cost.  I advised that the cost could be higher or lower.  He verbalized understanding.

## 2019-02-09 ENCOUNTER — Encounter: Payer: Self-pay | Admitting: Lab

## 2019-02-09 ENCOUNTER — Other Ambulatory Visit: Payer: Self-pay | Admitting: Family Medicine

## 2019-02-09 MED ORDER — LISINOPRIL-HYDROCHLOROTHIAZIDE 20-25 MG PO TABS: 1.0000 | ORAL_TABLET | Freq: Every day | ORAL | 3 refills | Status: DC

## 2019-02-15 ENCOUNTER — Telehealth: Payer: Self-pay | Admitting: *Deleted

## 2019-02-15 NOTE — Telephone Encounter (Signed)
Copied from [place] [id number]. Topic: General - Other >> Feb 15, 2019 12:49 PM Oneta Rack wrote: Patient requesting most recent lab results, please print patient would like to pick up today.

## 2019-02-17 NOTE — Telephone Encounter (Signed)
Copies of pt's lab results were left at front office for pick up on 02/15/19.

## 2019-03-23 ENCOUNTER — Ambulatory Visit (INDEPENDENT_AMBULATORY_CARE_PROVIDER_SITE_OTHER): Payer: Self-pay | Admitting: Urology

## 2019-03-23 ENCOUNTER — Other Ambulatory Visit: Payer: Self-pay

## 2019-03-23 ENCOUNTER — Ambulatory Visit: Payer: Self-pay | Admitting: Urology

## 2019-03-23 VITALS — BP 153/95 | HR 82 | Ht 70.0 in | Wt 205.8 lb

## 2019-03-23 DIAGNOSIS — N5201 Erectile dysfunction due to arterial insufficiency: Secondary | ICD-10-CM

## 2019-03-23 DIAGNOSIS — E291 Testicular hypofunction: Secondary | ICD-10-CM

## 2019-03-23 NOTE — Progress Notes (Signed)
03/23/2019 1:35 PM   Mark Parsons. 05-30-1966 [id number]  Referring provider: Leone Haven, MD [address] [place],  [place] [address]  Chief Complaint  Patient presents with  . Abnormal Penile Curvature    HPI: 53 year old male presents to establish a local urologic care.  He has seen Dr. Jacqlyn Larsen for several years for BPH, erectile dysfunction, Peyronie's disease and hypogonadism.  He was last seen at [facility] in October 2019 by Dr. Wynetta Emery. 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  Most recent lab work 01/2019 remarkable for a hematocrit of 50.8.  He has not had a recent PSA or testosterone.  He has had Peyronie's disease approximately 10 years.  He has curvature to the right at 20 to 30 degrees at the proximal shaft.  His curvature has been stable for several years and does not interfere with intercourse.  He does not have discomfort.  He underwent interferon injections 8-9 years ago.  He denies bothersome lower urinary tract symptoms.  PMH: Past Medical History:  Diagnosis Date  . Depression   . Hypertension   . Low testosterone in male     Surgical History: No past surgical history on file.  Home Medications:  Allergies as of 03/23/2019   No Known Allergies     Medication List       Accurate as of March 23, 2019  1:35 PM. If you have any questions, ask your nurse or doctor.        STOP taking these medications   ondansetron 4 MG disintegrating tablet Commonly known as: Zofran ODT Stopped by: Abbie Sons, MD     TAKE these medications   FLUoxetine 40 MG capsule Commonly known as: PROZAC Take 1 capsule (40 mg total) by mouth daily.   lisinopril-hydrochlorothiazide 20-25 MG tablet Commonly known as: ZESTORETIC Take 1 tablet by mouth daily.   sildenafil 20 MG tablet Commonly known as: REVATIO TAKE 3 TO 5 TABLETS AS NEEDED   TESTOSTERONE IM INJECT 1 ML INTRAMUSCULARLY EVERY 2 WEEKS OR AS  INSTRUCTED       Allergies: No Known Allergies  Family History: Family History  Problem Relation Age of Onset  . Colon cancer Mother   . Multiple myeloma Father   . Aortic aneurysm Father   . Stroke Father     Social History:  reports that he has never smoked. He has never used smokeless tobacco. He reports current alcohol use of about 40.0 standard drinks of alcohol per week. He reports that he does not use drugs.  ROS: UROLOGY Frequent Urination?: No Hard to postpone urination?: No Burning/pain with urination?: No Get up at night to urinate?: No Leakage of urine?: No Urine stream starts and stops?: No Trouble starting stream?: No Do you have to strain to urinate?: No Blood in urine?: No Urinary tract infection?: No Sexually transmitted disease?: No Injury to kidneys or bladder?: No Painful intercourse?: No Weak stream?: No Erection problems?: No Penile pain?: No  Gastrointestinal Nausea?: No Vomiting?: No Indigestion/heartburn?: No Diarrhea?: No Constipation?: No  Constitutional Fever: No Night sweats?: No Weight loss?: No Fatigue?: No  Skin Skin rash/lesions?: No Itching?: No  Eyes Blurred vision?: No Double vision?: No  Ears/Nose/Throat Sore throat?: No Sinus problems?: No  Hematologic/Lymphatic Swollen glands?: No Easy bruising?: No  Cardiovascular Leg swelling?: No Chest pain?: No  Respiratory Cough?: No Shortness of breath?: No  Endocrine Excessive thirst?: No  Musculoskeletal Back pain?: No Joint pain?:  No  Neurological Headaches?: No Dizziness?: No  Psychologic Depression?: Yes Anxiety?: No  Physical Exam: BP (!) 153/95 (BP Location: Left Arm, Patient Position: Sitting, Cuff Size: Normal)   Pulse 82   Ht '5\' 10"'  (1.778 m)   Wt 205 lb 12.8 oz (93.4 kg)   BMI 29.53 kg/m   Constitutional:  Alert and oriented, No acute distress. HEENT: [place] AT, moist mucus membranes.  Trachea midline, no masses. Cardiovascular: No  clubbing, cyanosis, or edema. Respiratory: Normal respiratory effort, no increased work of breathing. GI: Abdomen is soft, nontender, nondistended, no abdominal masses GU: DRE performed 06/2018 Lymph: No cervical or inguinal lymphadenopathy. Skin: No rashes, bruises or suspicious lesions. Neurologic: Grossly intact, no focal deficits, moving all 4 extremities. Psychiatric: Normal mood and affect.   Assessment & Plan:   53 year old male with hypogonadism.  Testosterone was refilled.  Testosterone level and PSA were ordered.  He also requested a refill on sildenafil for ED.  Will continue to monitor the Peyronie's disease as it has been stable for several years.  Follow-up 6 months  Abbie Sons, MD  [facility] [address] [place], [place] [address] [phone]

## 2019-03-24 ENCOUNTER — Telehealth: Payer: Self-pay

## 2019-03-24 ENCOUNTER — Encounter: Payer: Self-pay | Admitting: Urology

## 2019-03-24 DIAGNOSIS — N5201 Erectile dysfunction due to arterial insufficiency: Secondary | ICD-10-CM | POA: Insufficient documentation

## 2019-03-24 MED ORDER — SILDENAFIL CITRATE 20 MG PO TABS: ORAL_TABLET | ORAL | 1 refills | Status: DC

## 2019-03-24 NOTE — Telephone Encounter (Signed)
Called pt, no answer. Unable to leave message as voicemail is full.  1st attempt.

## 2019-03-24 NOTE — Telephone Encounter (Signed)
Just FYI.

## 2019-03-24 NOTE — Telephone Encounter (Signed)
-----   Message from Abbie Sons, MD sent at 03/24/2019  7:53 AM EDT ----- Regarding: Blood work Chart reviewed after patient left yesterday and he has not had a PSA or testosterone level since September 2018.  Please schedule lab visit.

## 2019-03-24 NOTE — Telephone Encounter (Signed)
Pt called back and states that he will schedule a Lab appt at a later date.

## 2019-03-31 ENCOUNTER — Telehealth: Payer: Self-pay | Admitting: Family Medicine

## 2019-03-31 NOTE — Telephone Encounter (Signed)
This should be prescribed by his urologist. He should contact them for the refill. Thanks.

## 2019-03-31 NOTE — Telephone Encounter (Signed)
Medication Refill - Medication: TESTOSTERONE IM    Has the patient contacted their pharmacy? Yes.  Pt states PCP was supposed to send this in at his last appt. Please advise.   (Agent: If no, request that the patient contact the pharmacy for the refill.) (Agent: If yes, when and what did the pharmacy advise?)  Preferred Pharmacy (with phone number or street name):  [place], [place] - [address]  [place] [address]  Phone: [phone] Fax: [phone]  Not a 24 hour pharmacy; exact hours not known.     Agent: Please be advised that RX refills may take up to 3 business days. We ask that you follow-up with your pharmacy.

## 2019-03-31 NOTE — Telephone Encounter (Signed)
This Rx was prescribed by a historical provider.

## 2019-04-01 NOTE — Telephone Encounter (Signed)
Patient aware.

## 2019-04-02 ENCOUNTER — Other Ambulatory Visit: Payer: Self-pay

## 2019-04-21 ENCOUNTER — Ambulatory Visit: Payer: Self-pay | Admitting: Family Medicine

## 2019-06-17 ENCOUNTER — Telehealth: Payer: Self-pay

## 2019-06-17 NOTE — Telephone Encounter (Signed)
I do not know what the COVID19 antibody test costs. We could check with the lab. Please find out why he wants to know how long the symptoms last. Is he having symptoms?

## 2019-06-17 NOTE — Telephone Encounter (Signed)
Copied from [place] [id number]. Topic: General - Other >> Jun 17, 2019  1:04 PM Rayann Heman wrote: Reason for CRM: pt called and stated that he would like to know how much the covid antibody test cost and would also like to know how long symptoms last. Please advise

## 2019-06-17 NOTE — Telephone Encounter (Signed)
pt called and stated that he would like to know how much the covid antibody test cost and would also like to know how long symptoms last. Please advise  Nina,cma

## 2019-06-18 NOTE — Telephone Encounter (Signed)
Called patient and line was busy will try later

## 2019-06-28 NOTE — Telephone Encounter (Signed)
Could not reach patient to answer question.  Nina,cma

## 2019-07-06 ENCOUNTER — Other Ambulatory Visit: Payer: Self-pay

## 2019-07-06 DIAGNOSIS — Z20822 Contact with and (suspected) exposure to covid-19: Secondary | ICD-10-CM

## 2019-09-21 ENCOUNTER — Ambulatory Visit: Payer: Self-pay | Admitting: Urology

## 2019-09-22 ENCOUNTER — Ambulatory Visit: Payer: Self-pay | Admitting: Urology

## 2019-10-26 ENCOUNTER — Telehealth: Payer: Self-pay | Admitting: Family Medicine

## 2019-10-26 NOTE — Telephone Encounter (Signed)
Pt is wanting Testerone medication. Pt states that if Dr. Caryl Bis isn't going to give him the prescription he probably don't need the appt. He talked about other prescriptions but never said the exact names of prescriptions that need to be filled.  Bridgette  scheduled him an appt for an appt on Friday 2/19 @ 1:45pm. Please advise.  Coury Grieger,cma

## 2019-10-26 NOTE — Telephone Encounter (Signed)
He needs to see urology for his testosterone medication. He should keep his appointment for follow-up on his BP.

## 2019-10-26 NOTE — Telephone Encounter (Signed)
Pt is wanting Testerone medication. Pt states that if Dr. Caryl Bis isn't going to give him the prescription he probably don't need the appt. He talked about other prescriptions but never said the exact names of prescriptions that need to be filled. I scheduled him an appt for an appt on Friday 2/19 @ 1:45pm. Please advise.

## 2019-10-29 ENCOUNTER — Ambulatory Visit (INDEPENDENT_AMBULATORY_CARE_PROVIDER_SITE_OTHER): Payer: 59 | Admitting: Family Medicine

## 2019-10-29 ENCOUNTER — Encounter: Payer: Self-pay | Admitting: Family Medicine

## 2019-10-29 ENCOUNTER — Other Ambulatory Visit: Payer: Self-pay

## 2019-10-29 DIAGNOSIS — I10 Essential (primary) hypertension: Secondary | ICD-10-CM | POA: Diagnosis not present

## 2019-10-29 DIAGNOSIS — E291 Testicular hypofunction: Secondary | ICD-10-CM

## 2019-10-29 DIAGNOSIS — F339 Major depressive disorder, recurrent, unspecified: Secondary | ICD-10-CM | POA: Diagnosis not present

## 2019-10-29 MED ORDER — FLUOXETINE HCL 40 MG PO CAPS: 40.0000 mg | ORAL_CAPSULE | Freq: Every day | ORAL | 3 refills | Status: DC

## 2019-10-29 MED ORDER — LISINOPRIL-HYDROCHLOROTHIAZIDE 20-25 MG PO TABS: 1.0000 | ORAL_TABLET | Freq: Every day | ORAL | 3 refills | Status: DC

## 2019-10-29 NOTE — Assessment & Plan Note (Signed)
Pretty well controlled.  Continue Prozac.

## 2019-10-29 NOTE — Assessment & Plan Note (Signed)
Above goal though he reports it significantly improved when he is on testosterone.  We can have him come in for lab work.  He will continue his lisinopril HCTZ.  We are going to refer back to urology for testosterone treatment.

## 2019-10-29 NOTE — Assessment & Plan Note (Signed)
Refer back to urology.  I discussed that testosterone is a high risk medication and given this I like my patients to see a specialist to receive this medication.  Discussed that his polycythemia complicates matters as well and that adds to my reasoning that he see the urologist for this.

## 2019-10-29 NOTE — Progress Notes (Signed)
Virtual Visit via telephone Note  This visit type was conducted due to national recommendations for restrictions regarding the COVID-19 pandemic (e.g. social distancing).  This format is felt to be most appropriate for this patient at this time.  All issues noted in this document were discussed and addressed.  No physical exam was performed (except for noted visual exam findings with Video Visits).   I connected with Mark Parsons today at  1:45 PM EST by telephone and verified that I am speaking with the correct person using two identifiers. Location patient: home Location provider: work Persons participating in the virtual visit: patient, provider  I discussed the limitations, risks, security and privacy concerns of performing an evaluation and management service by telephone and the availability of in person appointments. I also discussed with the patient that there may be a patient responsible charge related to this service. The patient expressed understanding and agreed to proceed.  Interactive audio and video telecommunications were attempted between this provider and patient, however failed, due to patient having technical difficulties OR patient did not have access to video capability.  We continued and completed visit with audio only.   Reason for visit: follow-up  HPI: -Hypertension: Typically 132/92 on recent checks.  Typically runs 30-40 points lower when he is taking his testosterone.  He is taking lisinopril/HCTZ.  No chest pain, shortness of breath, or edema.  Hypogonadism: Patient was previously on testosterone.  He has not been able to get it refilled as he has not followed up with urology.  He does have a history of polycythemia which complicates treatment.  Depression: Patient notes he does have some mild depression though it seems to be circumstantial with living on his own and caring for his elderly father.  Prozac has been very beneficial.  No SI.   ROS: See pertinent  positives and negatives per HPI.  Past Medical History:  Diagnosis Date  . Depression   . Hypertension   . Low testosterone in male     No past surgical history on file.  Family History  Problem Relation Age of Onset  . Colon cancer Mother   . Multiple myeloma Father   . Aortic aneurysm Father   . Stroke Father     SOCIAL HX: Non-smoker   Current Outpatient Medications:  .  FLUoxetine (PROZAC) 40 MG capsule, Take 1 capsule (40 mg total) by mouth daily., Disp: 90 capsule, Rfl: 3 .  lisinopril-hydrochlorothiazide (ZESTORETIC) 20-25 MG tablet, Take 1 tablet by mouth daily., Disp: 90 tablet, Rfl: 3 .  sildenafil (REVATIO) 20 MG tablet, TAKE 3 TO 5 TABLETS AS NEEDED 1 hour prior to intercourse, Disp: 90 tablet, Rfl: 1 .  TESTOSTERONE IM, INJECT 1 ML INTRAMUSCULARLY EVERY 2 WEEKS OR AS INSTRUCTED, Disp: , Rfl:   EXAM: This was a telehealth telephone visit and thus no physical exam was completed.  ASSESSMENT AND PLAN:  Discussed the following assessment and plan:  Benign essential hypertension Above goal though he reports it significantly improved when he is on testosterone.  We can have him come in for lab work.  He will continue his lisinopril HCTZ.  We are going to refer back to urology for testosterone treatment.  Hypogonadism in male Refer back to urology.  I discussed that testosterone is a high risk medication and given this I like my patients to see a specialist to receive this medication.  Discussed that his polycythemia complicates matters as well and that adds to my reasoning that he  see the urologist for this.  Depression, recurrent ([place]) Pretty well controlled.  Continue Prozac.   Orders Placed This Encounter  Procedures  . Testosterone    Standing Status:   Future    Standing Expiration Date:   10/28/2020  . PSA    Standing Status:   Future    Standing Expiration Date:   10/28/2020  . Basic Metabolic Panel (BMET)    Standing Status:   Future    Standing  Expiration Date:   10/28/2020  . CBC    Standing Status:   Future    Standing Expiration Date:   10/28/2020  . Ambulatory referral to Urology    Referral Priority:   Routine    Referral Type:   Consultation    Referral Reason:   Specialty Services Required    Requested Specialty:   Urology    Number of Visits Requested:   1    Meds ordered this encounter  Medications  . FLUoxetine (PROZAC) 40 MG capsule    Sig: Take 1 capsule (40 mg total) by mouth daily.    Dispense:  90 capsule    Refill:  3  . lisinopril-hydrochlorothiazide (ZESTORETIC) 20-25 MG tablet    Sig: Take 1 tablet by mouth daily.    Dispense:  90 tablet    Refill:  3     I discussed the assessment and treatment plan with the patient. The patient was provided an opportunity to ask questions and all were answered. The patient agreed with the plan and demonstrated an understanding of the instructions.   The patient was advised to call back or seek an in-person evaluation if the symptoms worsen or if the condition fails to improve as anticipated.  I provided 15 minutes of non-face-to-face time during this encounter.   [name], MD

## 2019-11-04 ENCOUNTER — Other Ambulatory Visit (INDEPENDENT_AMBULATORY_CARE_PROVIDER_SITE_OTHER): Payer: 59

## 2019-11-04 ENCOUNTER — Other Ambulatory Visit: Payer: Self-pay

## 2019-11-04 DIAGNOSIS — E291 Testicular hypofunction: Secondary | ICD-10-CM

## 2019-11-04 DIAGNOSIS — I10 Essential (primary) hypertension: Secondary | ICD-10-CM | POA: Diagnosis not present

## 2019-11-04 LAB — BASIC METABOLIC PANEL
BUN: 14 mg/dL (ref 6–23)
CO2: 26 mEq/L (ref 19–32)
Calcium: 9.5 mg/dL (ref 8.4–10.5)
Chloride: 98 mEq/L (ref 96–112)
Creatinine, Ser: 0.95 mg/dL (ref 0.40–1.50)
GFR: 83.07 mL/min (ref >60.00)
Glucose, Bld: 136 mg/dL — ABNORMAL HIGH (ref 70–99)
Potassium: 3.8 mEq/L (ref 3.5–5.1)
Sodium: 136 mEq/L (ref 135–145)

## 2019-11-04 LAB — PSA: PSA: 0.54 ng/mL (ref 0.10–4.00)

## 2019-11-04 LAB — CBC
HCT: 48.5 % (ref 39.0–52.0)
Hemoglobin: 16.5 g/dL (ref 13.0–17.0)
MCHC: 33.9 g/dL (ref 30.0–36.0)
MCV: 97 fl (ref 78.0–100.0)
Platelets: 248 10*3/uL (ref 150.0–400.0)
RBC: 5 Mil/uL (ref 4.22–5.81)
RDW: 13 % (ref 11.5–15.5)
WBC: 6.8 10*3/uL (ref 4.0–10.5)

## 2019-11-04 LAB — TESTOSTERONE: Testosterone: 544.92 ng/dL (ref 300.00–890.00)

## 2019-11-25 ENCOUNTER — Ambulatory Visit (INDEPENDENT_AMBULATORY_CARE_PROVIDER_SITE_OTHER): Payer: 59 | Admitting: Urology

## 2019-11-25 ENCOUNTER — Ambulatory Visit: Payer: Self-pay | Admitting: Urology

## 2019-11-25 ENCOUNTER — Encounter: Payer: Self-pay | Admitting: Urology

## 2019-11-25 ENCOUNTER — Other Ambulatory Visit: Payer: Self-pay

## 2019-11-25 VITALS — BP 136/89 | HR 108 | Ht 70.0 in | Wt 200.0 lb

## 2019-11-25 DIAGNOSIS — N486 Induration penis plastica: Secondary | ICD-10-CM | POA: Diagnosis not present

## 2019-11-25 DIAGNOSIS — E349 Endocrine disorder, unspecified: Secondary | ICD-10-CM | POA: Diagnosis not present

## 2019-11-25 MED ORDER — TESTOSTERONE 20.25 MG/1.25GM (1.62%) TD GEL: 2.0000 | Freq: Every day | TRANSDERMAL | 5 refills | Status: DC

## 2019-11-25 MED ORDER — SILDENAFIL CITRATE 20 MG PO TABS: ORAL_TABLET | ORAL | 1 refills | Status: DC

## 2019-11-25 NOTE — Progress Notes (Signed)
11/25/2019 9:22 PM   Mark Parsons. 03-06-1966 [id number]  Referring provider: Leone Haven, MD [address] [place],  [place] [address]  Chief Complaint  Patient presents with  . Hypogonadism    HPI: Mark Parsons. 54 year old male with ED, BPH, Peyronie's disease returns today for a 6 month follow up for testosterone deficiency.  Testosterone deficiency He has had testosterone deficiency for the last 10 years.  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  He has been treating his testosterone deficiency with testosterone topical gel 1.62%, 2 pumps daily.  Most recent lab work 10/2019 PSA was  0.54 and testosterone 544.92.    Peyronie's Disease He has had Peyronie's disease approximately 10 years.  He has curvature to the right at 20 to 30 degrees at the proximal shaft.  His curvature has been stable for several years and does not interfere with intercourse.  He does not have discomfort.  He underwent interferon injections 8-9 years ago.  He denies bothersome lower urinary tract symptoms.  PMH: Past Medical History:  Diagnosis Date  . Depression   . Hypertension   . Low testosterone in male     Surgical History: History reviewed. No pertinent surgical history.  Home Medications:  Allergies as of 11/25/2019      Reactions   Propofol    unknown      Medication List       Accurate as of November 25, 2019 11:59 PM. If you have any questions, ask your nurse or doctor.        STOP taking these medications   TESTOSTERONE IM Replaced by: Testosterone 20.25 MG/1.25GM (1.62%) Gel Stopped by: Kahleb Mcclane, PA-C     TAKE these medications   FLUoxetine 40 MG capsule Commonly known as: PROZAC Take 1 capsule (40 mg total) by mouth daily.   lisinopril-hydrochlorothiazide 20-25 MG tablet Commonly known as: ZESTORETIC Take 1 tablet by mouth daily.   sildenafil 20 MG tablet Commonly known as: REVATIO TAKE  3 TO 5 TABLETS AS NEEDED 1 hour prior to intercourse   Testosterone 20.25 MG/1.25GM (1.62%) Gel Commonly known as: AndroGel Apply 2 Pump topically daily. Replaces: TESTOSTERONE IM Started by: Zara Council, PA-C       Allergies:  Allergies  Allergen Reactions  . Propofol     unknown    Family History: Family History  Problem Relation Age of Onset  . Colon cancer Mother   . Multiple myeloma Father   . Aortic aneurysm Father   . Stroke Father     Social History:  reports that he has never smoked. He has never used smokeless tobacco. He reports current alcohol use of about 40.0 standard drinks of alcohol per week. He reports that he does not use drugs.  ROS: For pertinent review of systems please refer to history of present illness  Physical Exam: BP 136/89   Pulse (!) 108   Ht '5\' 10"'  (1.778 m)   Wt 200 lb (90.7 kg)   BMI 28.70 kg/m   Constitutional:  Well nourished. Alert and oriented, No acute distress. HEENT: [place] AT, mask in place.  Trachea midline Cardiovascular: No clubbing, cyanosis, or edema. Respiratory: Normal respiratory effort, no increased work of breathing. GI: Abdomen is soft, non tender, non distended, no abdominal masses.  GU: No CVA tenderness.  No bladder fullness or masses.  Quarter sized Peyronie's plaque at the base.  Urethral meatus is patent.  No penile discharge.  No penile lesions or rashes. Scrotum without lesions, cysts, rashes and/or edema.  Testicles are located scrotally bilaterally. No masses are appreciated in the testicles. Left and right epididymis are normal. Rectal: Patient with  normal sphincter tone. Anus and perineum without scarring or rashes. No rectal masses are appreciated. Prostate is approximately 40 grams, no nodules are appreciated. Seminal vesicles are normal. Skin: No rashes, bruises or suspicious lesions. Lymph: No inguinal adenopathy. Neurologic: Grossly intact, no focal deficits, moving all 4 extremities. Psychiatric:  Normal mood and affect.  Assessment & Plan:    1. Testosterone deficiency Continue AndroGel 1.62%, 2 pumps daily RTC in 6 months for HCT, Hgb, PSA and testosterone levels  2. Peyronie's diease Stable Continue conservative management   Zara Council, PA-C  [facility] [address], [place], [place] [address] [phone] I, Stacie Glaze, am acting as a Education administrator for [employer], PA-C.  I have reviewed the above documentation for accuracy and completeness, and I agree with the above.    Zara Council, PA-C

## 2019-12-01 ENCOUNTER — Telehealth: Payer: Self-pay | Admitting: Urology

## 2019-12-01 NOTE — Telephone Encounter (Signed)
Patient called the office today.  He went to the pharmacy to pick up his prescription for testosterone gel.  He says that Androgel is too expensive and he wants the generic testosterone gel made by a company called [employer].  Do we need to resubmit the prescription to the pharmacy.  Patient can be reached at [phone].

## 2019-12-02 NOTE — Telephone Encounter (Signed)
Patient notified that he would need to speak to his pharmacy in regards to which vendor they order the medication from. The script sent does states substitution available, so they are able to fill generic. If the company is a [employer] we will need that information or form to complete if so. Patient verbalized understanding

## 2019-12-28 ENCOUNTER — Ambulatory Visit: Payer: 59 | Attending: Internal Medicine

## 2019-12-28 DIAGNOSIS — Z23 Encounter for immunization: Secondary | ICD-10-CM

## 2019-12-28 NOTE — Progress Notes (Signed)
   [id number]  Name:  Mark Parsons.    MRN: [id number] DOB: 1965-10-06  12/28/2019  Mark Parsons was observed post Covid-19 immunization for 15 minutes without incident. He was provided with Vaccine Information Sheet and instruction to access the V-Safe system.   Mark Parsons was instructed to call 911 with any severe reactions post vaccine: Marland Kitchen Difficulty breathing  . Swelling of face and throat  . A fast heartbeat  . A bad rash all over body  . Dizziness and weakness   Immunizations Administered    Name Date Dose VIS Date Route   Pfizer COVID-19 Vaccine 12/28/2019  1:31 PM 0.3 mL 11/03/2018 Intramuscular   Manufacturer: [place]   Lot: [id number]   [place]: [id number]

## 2020-01-18 ENCOUNTER — Ambulatory Visit: Payer: 59 | Attending: Internal Medicine

## 2020-01-18 DIAGNOSIS — Z23 Encounter for immunization: Secondary | ICD-10-CM

## 2020-01-18 NOTE — Progress Notes (Signed)
   [id number]  Name:  Mark Parsons.    MRN: [id number] DOB: 12/20/1965  01/18/2020  Mr. Mark Parsons was observed post Covid-19 immunization for 15 minutes without incident. He was provided with Vaccine Information Sheet and instruction to access the V-Safe system.   Mr. Mark Parsons was instructed to call 911 with any severe reactions post vaccine: Marland Kitchen Difficulty breathing  . Swelling of face and throat  . A fast heartbeat  . A bad rash all over body  . Dizziness and weakness   Immunizations Administered    Name Date Dose VIS Date Route   Pfizer COVID-19 Vaccine 01/18/2020  1:18 PM 0.3 mL 11/03/2018 Intramuscular   Manufacturer: [place]   Lot: [id number]   [place]: [id number]

## 2020-05-01 ENCOUNTER — Other Ambulatory Visit: Payer: Self-pay | Admitting: Family Medicine

## 2020-05-01 DIAGNOSIS — I10 Essential (primary) hypertension: Secondary | ICD-10-CM

## 2020-05-05 ENCOUNTER — Other Ambulatory Visit: Payer: Self-pay

## 2020-05-05 ENCOUNTER — Ambulatory Visit (INDEPENDENT_AMBULATORY_CARE_PROVIDER_SITE_OTHER): Payer: 59 | Admitting: Family Medicine

## 2020-05-05 ENCOUNTER — Encounter: Payer: Self-pay | Admitting: Family Medicine

## 2020-05-05 VITALS — BP 127/80 | HR 99 | Temp 98.4°F | Ht 70.0 in | Wt 213.0 lb

## 2020-05-05 DIAGNOSIS — N5201 Erectile dysfunction due to arterial insufficiency: Secondary | ICD-10-CM | POA: Diagnosis not present

## 2020-05-05 DIAGNOSIS — E785 Hyperlipidemia, unspecified: Secondary | ICD-10-CM

## 2020-05-05 DIAGNOSIS — I10 Essential (primary) hypertension: Secondary | ICD-10-CM

## 2020-05-05 DIAGNOSIS — F1010 Alcohol abuse, uncomplicated: Secondary | ICD-10-CM

## 2020-05-05 DIAGNOSIS — E291 Testicular hypofunction: Secondary | ICD-10-CM | POA: Diagnosis not present

## 2020-05-05 DIAGNOSIS — R7303 Prediabetes: Secondary | ICD-10-CM

## 2020-05-05 DIAGNOSIS — R42 Dizziness and giddiness: Secondary | ICD-10-CM | POA: Insufficient documentation

## 2020-05-05 DIAGNOSIS — L905 Scar conditions and fibrosis of skin: Secondary | ICD-10-CM

## 2020-05-05 NOTE — Assessment & Plan Note (Signed)
Check lipid panel  

## 2020-05-05 NOTE — Progress Notes (Signed)
[name], MD Phone: [phone]  Mark Parsons. is a 54 y.o. male who presents today for f/u.  HYPERTENSION  Disease Monitoring  Home BP Monitoring 654 systolic earlier today Chest pain- no    Dyspnea- no Medications  Compliance-  Taking lisiopril, HCTZ.   Edema- no  Excessive alcohol use: Drinks about a sixpack at night.  Has thought about cutting down.  Vertigo: Patient notes he rarely has issues with this.  Occurs about once every 3 to 4 weeks and last for 5 to 10 seconds at a time.  Typically occurs when he sitting in the car and turns his head to look at the traffic.  No tinnitus, ear fullness, or hearing loss.  Hypogonadism/erectile dysfunction: He notes his current testosterone supplement through urology seems to be working better.  He notes he is absorbing it better.  He notes he is not getting as good of erections as he used to on the other testosterone supplement though he notes the sildenafil works very well for him.  Scar: Notes he fell about a year ago and hit his head on the floor.  He notes he was evaluated and had a CT scan which was unremarkable.  Unremarkable CT head, cervical spine, and maxillofacial in December 2019.  He had a laceration to his forehead at that time and wondered if the scar would improve.    Social History   Tobacco Use  Smoking Status Never Smoker  Smokeless Tobacco Never Used     ROS see history of present illness  Objective  Physical Exam Vitals:   05/05/20 1550  BP: 127/80  Pulse: 99  Temp: 98.4 F (36.9 C)  SpO2: 97%    BP Readings from Last 3 Encounters:  05/05/20 127/80  11/25/19 136/89  03/23/19 (!) 153/95   Wt Readings from Last 3 Encounters:  05/05/20 213 lb (96.6 kg)  11/25/19 200 lb (90.7 kg)  10/29/19 200 lb (90.7 kg)    Physical Exam Constitutional:      General: He is not in acute distress.    Appearance: He is not diaphoretic.  HENT:     Head:      Right Ear: Tympanic membrane normal.      Left Ear: Tympanic membrane normal.     Ears:     Comments: Patient with dizziness on Dix-Hallpike testing on the right though no nystagmus, negative Dix-Hallpike on the left Cardiovascular:     Rate and Rhythm: Normal rate and regular rhythm.     Heart sounds: Normal heart sounds.  Pulmonary:     Effort: Pulmonary effort is normal.     Breath sounds: Normal breath sounds.  Skin:    General: Skin is warm and dry.  Neurological:     Mental Status: He is alert.      Assessment/Plan: Please see individual problem list.  Benign essential hypertension Adequate control.  Continue current regimen.  Check labs today.  Erectile dysfunction due to arterial insufficiency Patient is following with urology for this.  Discussed its not an uncommon occurrence as men age to have erectile dysfunction issues discussed this was likely related to poor blood flow.  He can continue the sildenafil as prescribed.  Hypogonadism in male He will continue to see urology.  Excessive drinking of alcohol Encouraged cutting back on alcohol intake.  Discussed risk to his liver.  Discussed we could refer him to somebody for assistance with this if you would like.  He will let us know if  he would like this in the future.  Discussed if he is going to cut back on his own he needs to do it very slowly given the risk of alcohol withdrawal and death.  HLD (hyperlipidemia) Check lipid panel.  Scar Discussed this likely would not improve significantly moving forward as it has been over a year and a half since this occurred.  Vertigo Possibly BPPV given dizziness on Dix-Hallpike.  He was given modified Epley maneuver to complete at home.  If he has worsening symptoms he will let us know.    Orders Placed This Encounter  Procedures  . Comp Met (CMET)  . HgB A1c  . Lipid panel    No orders of the defined types were placed in this encounter.   This visit occurred during the SARS-CoV-2 public health emergency.   Safety protocols were in place, including screening questions prior to the visit, additional usage of staff PPE, and extensive cleaning of exam room while observing appropriate contact time as indicated for disinfecting solutions.    [name], MD [facility]

## 2020-05-05 NOTE — Assessment & Plan Note (Signed)
Discussed this likely would not improve significantly moving forward as it has been over a year and a half since this occurred.

## 2020-05-05 NOTE — Assessment & Plan Note (Signed)
Patient is following with urology for this.  Discussed its not an uncommon occurrence as men age to have erectile dysfunction issues discussed this was likely related to poor blood flow.  He can continue the sildenafil as prescribed.

## 2020-05-05 NOTE — Assessment & Plan Note (Signed)
Adequate control.  Continue current regimen.  Check labs today.

## 2020-05-05 NOTE — Assessment & Plan Note (Signed)
Encouraged cutting back on alcohol intake.  Discussed risk to his liver.  Discussed we could refer him to somebody for assistance with this if you would like.  He will let us know if he would like this in the future.  Discussed if he is going to cut back on his own he needs to do it very slowly given the risk of alcohol withdrawal and death.

## 2020-05-05 NOTE — Patient Instructions (Signed)
Nice to see you. We will get lab work today and contact you with the results. Please do the exercises for the vertigo to see if that improves your symptoms.

## 2020-05-05 NOTE — Assessment & Plan Note (Signed)
Possibly BPPV given dizziness on Dix-Hallpike.  He was given modified Epley maneuver to complete at home.  If he has worsening symptoms he will let us know.

## 2020-05-05 NOTE — Assessment & Plan Note (Signed)
He will continue to see urology. 

## 2020-05-06 LAB — HEMOGLOBIN A1C
Hgb A1c MFr Bld: 5.6 % of total Hgb (ref <5.7)
Mean Plasma Glucose: 114 (calc)
eAG (mmol/L): 6.3 (calc)

## 2020-05-06 LAB — COMPREHENSIVE METABOLIC PANEL
AG Ratio: 1.3 (calc) (ref 1.0–2.5)
ALT: 25 U/L (ref 9–46)
AST: 19 U/L (ref 10–35)
Albumin: 4.3 g/dL (ref 3.6–5.1)
Alkaline phosphatase (APISO): 70 U/L (ref 35–144)
BUN: 17 mg/dL (ref 7–25)
CO2: 29 mmol/L (ref 20–32)
Calcium: 9.6 mg/dL (ref 8.6–10.3)
Chloride: 98 mmol/L (ref 98–110)
Creat: 1 mg/dL (ref 0.70–1.33)
Globulin: 3.2 g/dL (calc) (ref 1.9–3.7)
Glucose, Bld: 97 mg/dL (ref 65–99)
Potassium: 3.8 mmol/L (ref 3.5–5.3)
Sodium: 137 mmol/L (ref 135–146)
Total Bilirubin: 0.4 mg/dL (ref 0.2–1.2)
Total Protein: 7.5 g/dL (ref 6.1–8.1)

## 2020-05-06 LAB — LIPID PANEL
Cholesterol: 252 mg/dL — ABNORMAL HIGH (ref <200)
HDL: 45 mg/dL (ref >=40)
Non-HDL Cholesterol (Calc): 207 mg/dL (calc) — ABNORMAL HIGH (ref <130)
Total CHOL/HDL Ratio: 5.6 (calc) — ABNORMAL HIGH (ref <5.0)
Triglycerides: 444 mg/dL — ABNORMAL HIGH (ref <150)

## 2020-05-09 ENCOUNTER — Telehealth: Payer: Self-pay

## 2020-05-09 NOTE — Telephone Encounter (Signed)
-----   Message from Leone Haven, MD sent at 05/09/2020 12:53 PM EDT ----- Please let the patient know his cholesterol appears to be elevated.  Please see if he was fasting for this test and if he was not we will need to repeat the testing fasting.  His other lab work is acceptable.  Thanks.

## 2020-05-30 NOTE — Progress Notes (Signed)
11/25/2019 10:26 AM   Mark Parsons. 03-18-1966 [id number]  Referring provider: Leone Haven, MD [address] [place],  [place] [address]  Chief Complaint  Patient presents with  . Hypogonadism    79mo    HPI: Mark Parsons 54year old male with ED, BPH, Peyronie's disease returns today for a 6 month follow up for testosterone deficiency.  Testosterone deficiency He has had testosterone deficiency for the last 10 years.  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  He has been treating his testosterone deficiency with testosterone topical gel 1.62%, 2 pumps daily.  Most recent lab work 10/2019 PSA was  0.54 and testosterone 544.92.    BPH WITH LUTS  (prostate and/or bladder) IPSS score: 13/3     Major complaint(s): Frequency x several months. Denies any dysuria, hematuria or suprapubic pain.    Denies any recent fevers, chills, nausea or vomiting.  Mom with colon cancer   UA is negative   IPSS    Row Name 05/31/20 1400         International Prostate Symptom Score   How often have you had the sensation of not emptying your bladder? More than half the time     How often have you had to urinate less than every two hours? About half the time     How often have you found you stopped and started again several times when you urinated? Not at All     How often have you found it difficult to postpone urination? Less than half the time     How often have you had a weak urinary stream? Less than 1 in 5 times     How often have you had to strain to start urination? Less than 1 in 5 times     How many times did you typically get up at night to urinate? 2 Times     Total IPSS Score 13       Quality of Life due to urinary symptoms   If you were to spend the rest of your life with your urinary condition just the way it is now how would you feel about that? Mixed            Score:  1-7 Mild 8-19 Moderate 20-35  Severe   Erectile dysfunction SHIM score: 12    Main complaint: losing erections x several years Risk factors:  age, BPH, testosterone deficiency, alcohol abuse, smoking, antidepressants and pain medication  No painful erections, but mild curvatures with his erections.    Still having spontaneous erections Tried:    PDE5i's with moderate success   SHIM    Row Name 05/31/20 1408         SHIM: Over the last 6 months:   How do you rate your confidence that you could get and keep an erection? Low     When you had erections with sexual stimulation, how often were your erections hard enough for penetration (entering your partner)? A Few Times (much less than half the time)     During sexual intercourse, how often were you able to maintain your erection after you had penetrated (entered) your partner? A Few Times (much less than half the time)     During sexual intercourse, how difficult was it to maintain your erection to completion of intercourse? Difficult     When you attempted sexual intercourse, how often was it satisfactory for you?  Sometimes (about half the time)       SHIM Total Score   SHIM 12            Score: 1-7 Severe ED 8-11 Moderate ED 12-16 Mild-Moderate ED 17-21 Mild ED 22-25 No ED  Peyronie's Disease He has had Peyronie's disease approximately 10 years.  He has curvature to the right at 20 to 30 degrees at the proximal shaft.  His curvature has been stable for several years and does not interfere with intercourse.  He does not have discomfort.  He underwent interferon injections 8-9 years ago.   PMH: Past Medical History:  Diagnosis Date  . Depression   . Hypertension   . Low testosterone in male     Surgical History: No past surgical history on file.  Home Medications:  Allergies as of 05/31/2020      Reactions   Propofol    unknown      Medication List       Accurate as of May 31, 2020 11:59 PM. If you have any questions, ask your nurse  or doctor.        STOP taking these medications   sildenafil 20 MG tablet Commonly known as: REVATIO Stopped by: Mark Lohmeyer, PA-C     TAKE these medications   FLUoxetine 40 MG capsule Commonly known as: PROZAC Take 1 capsule by mouth once daily   lisinopril-hydrochlorothiazide 20-25 MG tablet Commonly known as: ZESTORETIC Take 1 tablet by mouth once daily   tadalafil 20 MG tablet Commonly known as: CIALIS Take 1 tablet (20 mg total) by mouth daily as needed for erectile dysfunction. Started by: Mark Council, PA-C   Testosterone 20.25 MG/1.25GM (1.62%) Gel Commonly known as: AndroGel Apply 2 Pump topically daily.       Allergies:  Allergies  Allergen Reactions  . Propofol     unknown    Family History: Family History  Problem Relation Age of Onset  . Colon cancer Mother   . Multiple myeloma Father   . Aortic aneurysm Father   . Stroke Father     Social History:  reports that he has never smoked. He has never used smokeless tobacco. He reports current alcohol use of about 40.0 standard drinks of alcohol per week. He reports that he does not use drugs.  ROS: For pertinent review of systems please refer to history of present illness  Physical Exam: BP 137/85   Pulse (!) 106   Ht _0  (1.778 m)   Wt 205 lb (93 kg)   BMI 29.41 kg/m   Constitutional:  Well nourished. Alert and oriented, No acute distress. HEENT: [place] AT, mask in place.  Trachea midline Cardiovascular: No clubbing, cyanosis, or edema. Respiratory: Normal respiratory effort, no increased work of breathing. GU: No CVA tenderness.  No bladder fullness or masses.  Patient with circumcised phallus. Quarter sized Peyronie's plaque at the base.   Urethral meatus is patent.  No penile discharge. No penile lesions or rashes. Scrotum without lesions, cysts, rashes and/or edema.  Testicles are located scrotally bilaterally. No masses are appreciated in the testicles. Left and right epididymis are  normal. Rectal: Patient with  normal sphincter tone. Anus and perineum without scarring or rashes. No rectal masses are appreciated. Prostate is approximately 40 grams, could only palpate the apex, no nodules are appreciated. Seminal vesicles could not be palpated Skin: No rashes, bruises or suspicious lesions. Lymph: No inguinal adenopathy. Neurologic: Grossly intact, no focal deficits, moving all 4 extremities.  Psychiatric: Normal mood and affect.  Laboratory Data Urinalysis Component     Latest Ref Rng & Units 05/31/2020  Specific Gravity, UA     1.005 - 1.030 1.025  pH, UA     5.0 - 7.5 6.0  Color, UA     Yellow Orange  Appearance Ur     Clear Hazy (A)  Leukocytes,UA     Negative Negative  Protein,UA     Negative/Trace Negative  Glucose, UA     Negative Negative  Ketones, UA     Negative Negative  RBC, UA     Negative Negative  Bilirubin, UA     Negative Negative  Urobilinogen, Ur     0.2 - 1.0 mg/dL 0.2  Nitrite, UA     Negative Negative  Microscopic Examination      See below:   Component     Latest Ref Rng & Units 05/31/2020  WBC, UA     0 - 5 /hpf 0-5  RBC     0 - 2 /hpf 0-2  Epithelial Cells (non renal)     0 - 10 /hpf 0-10  Casts     None seen /lpf Present (A)  Cast Type     N/A Hyaline casts  Mucus, UA     Not Estab. Present (A)  Bacteria, UA     None seen/Few None seen    Assessment & Plan:    1. Testosterone deficiency Continue AndroGel 1.62%, 2 pumps daily RTC in 6 months for HCT, Hgb, PSA and testosterone levels - if today's blood work is normal   2. BPH with LUTS IPSS score is 13/3 Continue conservative management, avoiding bladder irritants and timed voiding's Most bothersome symptoms is/are frequency Not interested in further treatments RTC in 6 months for IPSS, PSA, PVR and exam   3. Erectile dysfunction - SHIM score is 12 - We discussed trying a different PDE5 inhibitor, so I have sent in script for tadalafil 20 mg on demand  dosing - RTC in 6 months for repeat SHIM score and exam   4. Peyronie's diease Stable Continue conservative management  May want to consider Xiaflex if curve wosrsens  Mark Council, PA-C  [facility] [address] [place], [place] [address] [phone]

## 2020-05-31 ENCOUNTER — Ambulatory Visit (INDEPENDENT_AMBULATORY_CARE_PROVIDER_SITE_OTHER): Payer: 59 | Admitting: Urology

## 2020-05-31 ENCOUNTER — Other Ambulatory Visit: Payer: Self-pay

## 2020-05-31 ENCOUNTER — Encounter: Payer: Self-pay | Admitting: Urology

## 2020-05-31 VITALS — BP 137/85 | HR 106 | Ht 70.0 in | Wt 205.0 lb

## 2020-05-31 DIAGNOSIS — N5201 Erectile dysfunction due to arterial insufficiency: Secondary | ICD-10-CM

## 2020-05-31 DIAGNOSIS — E349 Endocrine disorder, unspecified: Secondary | ICD-10-CM

## 2020-05-31 DIAGNOSIS — N486 Induration penis plastica: Secondary | ICD-10-CM | POA: Diagnosis not present

## 2020-05-31 DIAGNOSIS — N401 Enlarged prostate with lower urinary tract symptoms: Secondary | ICD-10-CM | POA: Diagnosis not present

## 2020-05-31 DIAGNOSIS — N138 Other obstructive and reflux uropathy: Secondary | ICD-10-CM

## 2020-05-31 MED ORDER — TADALAFIL 20 MG PO TABS: 20.0000 mg | ORAL_TABLET | Freq: Every day | ORAL | 3 refills | Status: DC | PRN

## 2020-05-31 MED ORDER — TESTOSTERONE 20.25 MG/1.25GM (1.62%) TD GEL: 2.0000 | Freq: Every day | TRANSDERMAL | 5 refills | Status: DC

## 2020-06-01 ENCOUNTER — Telehealth: Payer: Self-pay | Admitting: Family Medicine

## 2020-06-01 LAB — PSA: Prostate Specific Ag, Serum: 1.2 ng/mL (ref 0.0–4.0)

## 2020-06-01 LAB — URINALYSIS, COMPLETE
Bilirubin, UA: NEGATIVE
Glucose, UA: NEGATIVE
Ketones, UA: NEGATIVE
Leukocytes,UA: NEGATIVE
Nitrite, UA: NEGATIVE
Protein,UA: NEGATIVE
RBC, UA: NEGATIVE
Specific Gravity, UA: 1.025 (ref 1.005–1.030)
Urobilinogen, Ur: 0.2 mg/dL (ref 0.2–1.0)
pH, UA: 6 (ref 5.0–7.5)

## 2020-06-01 LAB — TESTOSTERONE: Testosterone: 394 ng/dL (ref 264–916)

## 2020-06-01 LAB — MICROSCOPIC EXAMINATION: Bacteria, UA: NONE SEEN

## 2020-06-01 LAB — HEMATOCRIT: Hematocrit: 50.3 % (ref 37.5–51.0)

## 2020-06-01 LAB — HEMOGLOBIN: Hemoglobin: 17 g/dL (ref 13.0–17.7)

## 2020-06-01 NOTE — Telephone Encounter (Signed)
-----   Message from Nori Riis, PA-C sent at 06/01/2020  8:19 AM EDT ----- Please let Mark Parsons know that his lab work returned within normal levels.

## 2020-06-01 NOTE — Telephone Encounter (Signed)
Patient notified and voiced understanding.

## 2020-08-14 ENCOUNTER — Other Ambulatory Visit: Payer: Self-pay | Admitting: Family Medicine

## 2020-08-14 DIAGNOSIS — I10 Essential (primary) hypertension: Secondary | ICD-10-CM

## 2020-10-30 ENCOUNTER — Encounter: Payer: Self-pay | Admitting: Family Medicine

## 2020-11-06 ENCOUNTER — Ambulatory Visit: Payer: 59 | Admitting: Family Medicine

## 2020-11-27 ENCOUNTER — Other Ambulatory Visit: Payer: Self-pay

## 2020-11-29 ENCOUNTER — Other Ambulatory Visit: Payer: 59

## 2020-11-29 ENCOUNTER — Encounter: Payer: Self-pay | Admitting: Family Medicine

## 2020-11-29 ENCOUNTER — Other Ambulatory Visit: Payer: Self-pay | Admitting: Family Medicine

## 2020-11-29 ENCOUNTER — Ambulatory Visit (INDEPENDENT_AMBULATORY_CARE_PROVIDER_SITE_OTHER): Payer: 59

## 2020-11-29 ENCOUNTER — Other Ambulatory Visit: Payer: Self-pay

## 2020-11-29 ENCOUNTER — Ambulatory Visit (INDEPENDENT_AMBULATORY_CARE_PROVIDER_SITE_OTHER): Payer: 59 | Admitting: Family Medicine

## 2020-11-29 DIAGNOSIS — M25552 Pain in left hip: Secondary | ICD-10-CM

## 2020-11-29 DIAGNOSIS — E349 Endocrine disorder, unspecified: Secondary | ICD-10-CM

## 2020-11-29 DIAGNOSIS — N401 Enlarged prostate with lower urinary tract symptoms: Secondary | ICD-10-CM

## 2020-11-29 DIAGNOSIS — G8929 Other chronic pain: Secondary | ICD-10-CM

## 2020-11-29 DIAGNOSIS — M25562 Pain in left knee: Secondary | ICD-10-CM

## 2020-11-29 DIAGNOSIS — M25561 Pain in right knee: Secondary | ICD-10-CM

## 2020-11-29 DIAGNOSIS — N138 Other obstructive and reflux uropathy: Secondary | ICD-10-CM

## 2020-11-29 DIAGNOSIS — F339 Major depressive disorder, recurrent, unspecified: Secondary | ICD-10-CM | POA: Diagnosis not present

## 2020-11-29 DIAGNOSIS — I10 Essential (primary) hypertension: Secondary | ICD-10-CM

## 2020-11-29 DIAGNOSIS — M722 Plantar fascial fibromatosis: Secondary | ICD-10-CM

## 2020-11-29 DIAGNOSIS — R400 Somnolence: Secondary | ICD-10-CM | POA: Insufficient documentation

## 2020-11-29 LAB — BASIC METABOLIC PANEL
BUN: 16 mg/dL (ref 6–23)
CO2: 25 mEq/L (ref 19–32)
Calcium: 9.2 mg/dL (ref 8.4–10.5)
Chloride: 99 mEq/L (ref 96–112)
Creatinine, Ser: 0.85 mg/dL (ref 0.40–1.50)
GFR: 99.12 mL/min (ref >60.00)
Glucose, Bld: 129 mg/dL — ABNORMAL HIGH (ref 70–99)
Potassium: 3.7 mEq/L (ref 3.5–5.1)
Sodium: 136 mEq/L (ref 135–145)

## 2020-11-29 NOTE — Assessment & Plan Note (Signed)
Suspect osteoarthritis.  X-rays today.

## 2020-11-29 NOTE — Assessment & Plan Note (Signed)
Relatively stable.  Discussed the potential for increasing the Prozac or adding an additional medication though he defers this at this time.  He will continue Prozac 40 mg once daily.

## 2020-11-29 NOTE — Assessment & Plan Note (Signed)
Discussed that I would have expected plantar fasciitis to burn itself out at this point.  Offered referral to sports medicine or advised he could follow-up with podiatry again.  He opted to see podiatry.  He will contact them for an appointment.

## 2020-11-29 NOTE — Assessment & Plan Note (Signed)
Epworth Sleepiness Scale score of 4.  He does not have excessive daytime sleepiness.  I do not believe there is a reason for a sleep study at this time.

## 2020-11-29 NOTE — Assessment & Plan Note (Signed)
The patient has seen urology for this.  He notes they encouraged medication to treat this though he has not started on any of this at this time.  I discussed that the general medications are relatively safe and well-tolerated.  I encouraged him to discuss further with urology.

## 2020-11-29 NOTE — Patient Instructions (Signed)
Nice to see you. We will get x-rays today. Please do the following exercises for your hip. We will get labs as well.   Hip Bursitis Rehab Ask your health care provider which exercises are safe for you. Do exercises exactly as told by your health care provider and adjust them as directed. It is normal to feel mild stretching, pulling, tightness, or discomfort as you do these exercises. Stop right away if you feel sudden pain or your pain gets worse. Do not begin these exercises until told by your health care provider. Stretching exercise This exercise warms up your muscles and joints and improves the movement and flexibility of your hip. This exercise also helps to relieve pain and stiffness. Iliotibial band stretch An iliotibial band is a strong band of muscle tissue that runs from the outer side of your hip to the outer side of your thigh and knee. 1. Lie on your side with your left / right leg in the top position. 2. Bend your left / right knee and grab your ankle. Stretch out your bottom arm to help you balance. 3. Slowly bring your knee back so your thigh is behind your body. 4. Slowly lower your knee toward the floor until you feel a gentle stretch on the outside of your left / right thigh. If you do not feel a stretch and your knee will not fall farther, place the heel of your other foot on top of your knee and pull your knee down toward the floor with your foot. 5. Hold this position for __________ seconds. 6. Slowly return to the starting position. Repeat __________ times. Complete this exercise __________ times a day.   Strengthening exercises These exercises build strength and endurance in your hip and pelvis. Endurance is the ability to use your muscles for a long time, even after they get tired. Bridge This exercise strengthens the muscles that move your thigh backward (hip extensors). 1. Lie on your back on a firm surface with your knees bent and your feet flat on the  floor. 2. Tighten your buttocks muscles and lift your buttocks off the floor until your trunk is level with your thighs. ? Do not arch your back. ? You should feel the muscles working in your buttocks and the back of your thighs. If you do not feel these muscles, slide your feet 1-2 inches (2.5-5 cm) farther away from your buttocks. ? If this exercise is too easy, try doing it with your arms crossed over your chest. 3. Hold this position for __________ seconds. 4. Slowly lower your hips to the starting position. 5. Let your muscles relax completely after each repetition. Repeat __________ times. Complete this exercise __________ times a day.   Squats This exercise strengthens the muscles in front of your thigh and knee (quadriceps). 1. Stand in front of a table, with your feet and knees pointing straight ahead. You may rest your hands on the table for balance but not for support. 2. Slowly bend your knees and lower your hips like you are going to sit in a chair. ? Keep your weight over your heels, not over your toes. ? Keep your lower legs upright so they are parallel with the table legs. ? Do not let your hips go lower than your knees. ? Do not bend lower than told by your health care provider. ? If your hip pain increases, do not bend as low. 3. Hold the squat position for __________ seconds. 4. Slowly push with your  legs to return to standing. Do not use your hands to pull yourself to standing. Repeat __________ times. Complete this exercise __________ times a day. Hip hike 1. Stand sideways on a bottom step. Stand on your left / right leg with your other foot unsupported next to the step. You can hold on to the railing or wall for balance if needed. 2. Keep your knees straight and your torso square. Then lift your left / right hip up toward the ceiling. 3. Hold this position for __________ seconds. 4. Slowly let your left / right hip lower toward the floor, past the starting position.  Your foot should get closer to the floor. Do not lean or bend your knees. Repeat __________ times. Complete this exercise __________ times a day. Single leg stand 1. Without shoes, stand near a railing or in a doorway. You may hold on to the railing or door frame as needed for balance. 2. Squeeze your left / right buttock muscles, then lift up your other foot. ? Do not let your left / right hip push out to the side. ? It is helpful to stand in front of a mirror for this exercise so you can watch your hip. 3. Hold this position for __________ seconds. Repeat __________ times. Complete this exercise __________ times a day. This information is not intended to replace advice given to you by your health care provider. Make sure you discuss any questions you have with your health care provider. Document Revised: 12/21/2018 Document Reviewed: 12/21/2018 Elsevier Patient Education  [place].

## 2020-11-29 NOTE — Progress Notes (Signed)
Mark Rumps, MD Phone: [phone]  Mark Parsons Mark Parsons. is a 55 y.o. male who presents today for f/u.  HYPERTENSION  Disease Monitoring  Home BP Monitoring usually lower than today Chest pain- no    Dyspnea- no Medications  Compliance-  Taking lisinopril/hctz.  Edema- no  Depression: Patient does note some depression mostly related to his father situation.  He feels that the Prozac is helpful.  No SI.  Daytime sleepiness: Patient does report some drowsiness during the day.  No snoring.  No apneic episodes.  He does wake well rested.  He reports his prior urologist wondered if he had sleep apnea given his face shape being full.  Bilateral knee pain: This has been going on at least 3 to 4 years.  He was previously told he had osteoarthritis.  He played lots of basketball in is younger days.  Hurts most days particularly if he is moving around.  No injury.  Plantar fasciitis: This has been going on intermittently for 4 to 5 years.  He saw podiatry previously and had cortisone injections 3 times.  Notes it typically will come back after the injection.  Hurts with movement.  It is not worse right away in the morning.  Left hip pain: Patient notes he fell on this about 20 years ago.  Since then he has had issues with a throbbing particularly when it is damp and cold.  Does occasionally hurt at night when he lays on it.    Social History   Tobacco Use  Smoking Status Never Smoker  Smokeless Tobacco Never Used    Current Outpatient Medications on File Prior to Visit  Medication Sig Dispense Refill  . FLUoxetine (PROZAC) 40 MG capsule Take 1 capsule by mouth once daily 90 capsule 0  . lisinopril-hydrochlorothiazide (ZESTORETIC) 20-25 MG tablet Take 1 tablet by mouth once daily 90 tablet 0  . tadalafil (CIALIS) 20 MG tablet Take 1 tablet (20 mg total) by mouth daily as needed for erectile dysfunction. 30 tablet 3  . Testosterone (ANDROGEL) 20.25 MG/1.25GM (1.62%) GEL Apply 2 Pump  topically daily. 150 g 5   No current facility-administered medications on file prior to visit.     ROS see history of present illness  Objective  Physical Exam Vitals:   11/29/20 1013  BP: 118/80  Pulse: 96  Temp: 98.4 F (36.9 C)  SpO2: 97%    BP Readings from Last 3 Encounters:  11/29/20 118/80  05/31/20 137/85  05/05/20 127/80   Wt Readings from Last 3 Encounters:  11/29/20 217 lb 12.8 oz (98.8 kg)  05/31/20 205 lb (93 kg)  05/05/20 213 lb (96.6 kg)    Physical Exam Constitutional:      General: He is not in acute distress.    Appearance: He is not diaphoretic.  Cardiovascular:     Rate and Rhythm: Normal rate and regular rhythm.     Heart sounds: Normal heart sounds.  Pulmonary:     Effort: Pulmonary effort is normal.     Breath sounds: Normal breath sounds.  Musculoskeletal:     Right lower leg: No edema.     Left lower leg: No edema.       Feet:     Comments: Bilateral knees with no swelling, warmth, or erythema, there is slight tenderness over the joint lines bilaterally, negative McMurray's bilaterally, left hip with slight tenderness over the greater trochanter, good internal and external range of motion with no discomfort  Skin:  General: Skin is warm and dry.  Neurological:     Mental Status: He is alert.      Assessment/Plan: Please see individual problem list.  Problem List Items Addressed This Visit    Benign essential hypertension    Adequate control.  Continue lisinopril-HCTZ 1 tablet once daily.  Check BMP.      Relevant Orders   Basic Metabolic Panel (BMET)   Bilateral knee pain    Suspect osteoarthritis.  X-rays today.      Relevant Orders   DG Knee Complete 4 Views Right   DG Knee Complete 4 Views Left   Daytime sleepiness    Epworth Sleepiness Scale score of 4.  He does not have excessive daytime sleepiness.  I do not believe there is a reason for a sleep study at this time.      Depression, recurrent ([place])     Relatively stable.  Discussed the potential for increasing the Prozac or adding an additional medication though he defers this at this time.  He will continue Prozac 40 mg once daily.      Enlarged prostate with lower urinary tract symptoms (LUTS)    The patient has seen urology for this.  He notes they encouraged medication to treat this though he has not started on any of this at this time.  I discussed that the general medications are relatively safe and well-tolerated.  I encouraged him to discuss further with urology.      Left hip pain    Chronic issue.  Suspect trochanteric bursitis given exam.  Exercises given.      Plantar fasciitis of left foot    Discussed that I would have expected plantar fasciitis to burn itself out at this point.  Offered referral to sports medicine or advised he could follow-up with podiatry again.  He opted to see podiatry.  He will contact them for an appointment.          This visit occurred during the SARS-CoV-2 public health emergency.  Safety protocols were in place, including screening questions prior to the visit, additional usage of staff PPE, and extensive cleaning of exam room while observing appropriate contact time as indicated for disinfecting solutions.    Mark Rumps, MD [place]

## 2020-11-29 NOTE — Assessment & Plan Note (Signed)
Adequate control.  Continue lisinopril-HCTZ 1 tablet once daily.  Check BMP.

## 2020-11-29 NOTE — Assessment & Plan Note (Signed)
Chronic issue.  Suspect trochanteric bursitis given exam.  Exercises given.

## 2020-11-30 LAB — HEMOGLOBIN: Hemoglobin: 16.8 g/dL (ref 13.0–17.7)

## 2020-11-30 LAB — TESTOSTERONE: Testosterone: 188 ng/dL — ABNORMAL LOW (ref 264–916)

## 2020-11-30 LAB — PSA: Prostate Specific Ag, Serum: 0.7 ng/mL (ref 0.0–4.0)

## 2020-11-30 LAB — HEMATOCRIT: Hematocrit: 47.9 % (ref 37.5–51.0)

## 2020-12-04 NOTE — Progress Notes (Signed)
11/25/2019 2:08 PM   Mark Parsons. 1965-12-22 [id number]  Referring provider: Leone Haven, MD [address] [place],  [place] [address]  Chief Complaint  Patient presents with  . Benign Prostatic Hypertrophy   Urological history: 1. Testosterone deficiency -managed with testosterone topical gel 1.62%, 2 pumps  2. Erythrocytosis -managed with periodic phlebotomy  3. BPH with LU TS -PSA 0.7 in 11/2020  -I PSS 9/3 -PVR 27 mL  4. ED -SHIM 18 -contributory factors of age, BPH, testosterone deficiency, Peyronie's disease, alcohol abuse, smoking, antidepressants and pain medication  -managed with tadalafil 20 mg, on-demand-dosing  5. Peyronie's disease -underwent interferon injections ~ 10 years ago   HPI: Mark Parsons. is a 55 y.o. male who presents today for follow up.   His urinary complaint today is frequency.  Patient denies any modifying or aggravating factors.  Patient denies any gross hematuria, dysuria or suprapubic/flank pain.  Patient denies any fevers, chills, nausea or vomiting.     IPSS    Row Name 12/05/20 1400         International Prostate Symptom Score   How often have you had the sensation of not emptying your bladder? Less than half the time     How often have you had to urinate less than every two hours? About half the time     How often have you found you stopped and started again several times when you urinated? Not at All     How often have you found it difficult to postpone urination? Less than half the time     How often have you had a weak urinary stream? Not at All     How often have you had to strain to start urination? Not at All     How many times did you typically get up at night to urinate? 2 Times     Total IPSS Score 9           Quality of Life due to urinary symptoms   If you were to spend the rest of your life with your urinary condition just the way it is now how would you feel about that? Mixed             Score:  1-7 Mild 8-19 Moderate 20-35 Severe  Patient still having spontaneous erections.  He denies any pain or curvature with erections.    SHIM    Row Name 12/05/20 1403         SHIM: Over the last 6 months:   How do you rate your confidence that you could get and keep an erection? Moderate     When you had erections with sexual stimulation, how often were your erections hard enough for penetration (entering your partner)? Most Times (much more than half the time)     During sexual intercourse, how often were you able to maintain your erection after you had penetrated (entered) your partner? Most Times (much more than half the time)     During sexual intercourse, how difficult was it to maintain your erection to completion of intercourse? Slightly Difficult     When you attempted sexual intercourse, how often was it satisfactory for you? Sometimes (about half the time)           SHIM Total Score   SHIM 18            Score: 1-7 Severe ED 8-11 Moderate ED 12-16 Mild-Moderate ED 17-21  Mild ED 22-25 No ED  His testosterone was found to be subtherapeutic.  He states he is applying the AndroGel with 2 pumps daily.  He is also noted that he has had an increase in abdominal weight gain, lethargy, low libido and moodiness.   PMH: Past Medical History:  Diagnosis Date  . Depression   . Hypertension   . Low testosterone in male     Surgical History: History reviewed. No pertinent surgical history.  Home Medications:  Allergies as of 12/05/2020      Reactions   Propofol Anaphylaxis   Near death      Medication List       Accurate as of December 05, 2020  2:08 PM. If you have any questions, ask your nurse or doctor.        FLUoxetine 40 MG capsule Commonly known as: PROZAC Take 1 capsule by mouth once daily   lisinopril-hydrochlorothiazide 20-25 MG tablet Commonly known as: ZESTORETIC Take 1 tablet by mouth once daily   tadalafil 20 MG  tablet Commonly known as: CIALIS Take 1 tablet (20 mg total) by mouth daily as needed for erectile dysfunction.   Testosterone 20.25 MG/1.25GM (1.62%) Gel Commonly known as: AndroGel Apply 2 Pump topically daily.       Allergies:  Allergies  Allergen Reactions  . Propofol Anaphylaxis    Near death    Family History: Family History  Problem Relation Age of Onset  . Colon cancer Mother   . Multiple myeloma Father   . Aortic aneurysm Father   . Stroke Father     Social History:  reports that he has never smoked. He has never used smokeless tobacco. He reports current alcohol use of about 40.0 standard drinks of alcohol per week. He reports that he does not use drugs.  ROS: For pertinent review of systems please refer to history of present illness  Physical Exam: BP (!) 134/93   Pulse 74   Ht '5\' 10"'  (1.778 m)   Wt 212 lb (96.2 kg)   BMI 30.42 kg/m   Constitutional:  Well nourished. Alert and oriented, No acute distress. HEENT: [place] AT, mask in place.  Trachea midline Cardiovascular: No clubbing, cyanosis, or edema. Respiratory: Normal respiratory effort, no increased work of breathing. GU: No CVA tenderness.  No bladder fullness or masses.  Patient with circumcised phallus.  Urethral meatus is patent.  No penile discharge. No penile lesions or rashes. Scrotum without lesions, cysts, rashes and/or edema.  Testicles are located scrotally bilaterally. No masses are appreciated in the testicles. Left and right epididymis are normal. Rectal: Patient with  normal sphincter tone. Anus and perineum without scarring or rashes. No rectal masses are appreciated. Prostate is approximately 50 grams, could only palpate the apex, no nodules are appreciated. Seminal vesicles could not be palpated Lymph: No inguinal adenopathy. Neurologic: Grossly intact, no focal deficits, moving all 4 extremities. Psychiatric: Normal mood and affect.  Laboratory Data Results for orders placed or  performed in visit on 12/05/20  Bladder Scan (Post Void Residual) in office  Result Value Ref Range   Scan Result 27    Component     Latest Ref Rng & Units 11/29/2020  Sodium     135 - 145 mEq/L 136  Potassium     3.5 - 5.1 mEq/L 3.7  Chloride     96 - 112 mEq/L 99  CO2     19 - 32 mEq/L 25  Glucose     70 -  99 mg/dL 129 (H)  BUN     6 - 23 mg/dL 16  Creatinine     0.40 - 1.50 mg/dL 0.85  Calcium     8.4 - 10.5 mg/dL 9.2  GFR     >60.00 mL/min 99.12  I have reviewed the labs.   Assessment & Plan:    1. Testosterone deficiency -increase AndroGel 1.62%, to 3 pumps daily from 2 pumps daily  2. BPH with LUTS -IPSS score is stable, but patient is wanting to start medication at this time -Continue conservative management, avoiding bladder irritants and timed voiding's -start tamsulosin 0.4 mg daily  3. 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 Peyronie's diease -Stable -Continue conservative management  -May want to consider Xiaflex if curve worsens  Return in 1 month for serum testosterone level 2 to 4 hours after applying gel, hemoglobin hematocrit  Zara Council, PA-C  [facility] [address] [place], [place] [address] [phone]

## 2020-12-05 ENCOUNTER — Encounter: Payer: Self-pay | Admitting: Urology

## 2020-12-05 ENCOUNTER — Other Ambulatory Visit: Payer: Self-pay

## 2020-12-05 ENCOUNTER — Ambulatory Visit (INDEPENDENT_AMBULATORY_CARE_PROVIDER_SITE_OTHER): Payer: 59 | Admitting: Urology

## 2020-12-05 VITALS — BP 134/93 | HR 74 | Ht 70.0 in | Wt 212.0 lb

## 2020-12-05 DIAGNOSIS — E349 Endocrine disorder, unspecified: Secondary | ICD-10-CM | POA: Diagnosis not present

## 2020-12-05 DIAGNOSIS — N5201 Erectile dysfunction due to arterial insufficiency: Secondary | ICD-10-CM

## 2020-12-05 DIAGNOSIS — N138 Other obstructive and reflux uropathy: Secondary | ICD-10-CM

## 2020-12-05 DIAGNOSIS — N401 Enlarged prostate with lower urinary tract symptoms: Secondary | ICD-10-CM

## 2020-12-05 DIAGNOSIS — N486 Induration penis plastica: Secondary | ICD-10-CM

## 2020-12-05 LAB — BLADDER SCAN AMB NON-IMAGING: Scan Result: 27

## 2020-12-05 MED ORDER — TAMSULOSIN HCL 0.4 MG PO CAPS: 0.4000 mg | ORAL_CAPSULE | Freq: Every day | ORAL | 3 refills | Status: DC

## 2020-12-05 MED ORDER — TESTOSTERONE 20.25 MG/1.25GM (1.62%) TD GEL: 3.0000 | Freq: Every day | TRANSDERMAL | 5 refills | Status: DC

## 2020-12-12 ENCOUNTER — Other Ambulatory Visit: Payer: Self-pay | Admitting: Family Medicine

## 2020-12-12 DIAGNOSIS — I10 Essential (primary) hypertension: Secondary | ICD-10-CM

## 2020-12-26 ENCOUNTER — Other Ambulatory Visit: Payer: Self-pay | Admitting: *Deleted

## 2020-12-26 DIAGNOSIS — E349 Endocrine disorder, unspecified: Secondary | ICD-10-CM

## 2021-01-05 ENCOUNTER — Other Ambulatory Visit: Payer: Self-pay

## 2021-01-05 ENCOUNTER — Other Ambulatory Visit: Payer: 59

## 2021-01-05 DIAGNOSIS — E349 Endocrine disorder, unspecified: Secondary | ICD-10-CM

## 2021-01-06 LAB — HEMATOCRIT: Hematocrit: 46.8 % (ref 37.5–51.0)

## 2021-01-06 LAB — TESTOSTERONE: Testosterone: 458 ng/dL (ref 264–916)

## 2021-01-06 LAB — HEMOGLOBIN: Hemoglobin: 16 g/dL (ref 13.0–17.7)

## 2021-01-09 ENCOUNTER — Telehealth: Payer: Self-pay

## 2021-01-10 NOTE — Telephone Encounter (Signed)
ERROR

## 2021-03-19 ENCOUNTER — Telehealth: Payer: Self-pay | Admitting: Family Medicine

## 2021-03-19 NOTE — Telephone Encounter (Signed)
Noted. Agree with advice given. The patient could be offered a sooner appointment if he would like.

## 2021-03-19 NOTE — Telephone Encounter (Signed)
Patient walk in wanting to be screened for Afib that his cousin had advised him he may have Afib? Ask patient he was having any symptoms he said no that he just been tired recently , but no palpitations , no chest pain, no shortness of breath, able to do normal activities. Vitals Taken BP 118/84 pulse 88 temp 97.6 oral, 02 sat @ 97% on room air. Resp 18 , Pain rating 0 on 0-10 scale.  Patient pulse and heart sounds at normal rate and rhythm in office. No visual signs of distress noted.   Spoke with PCP ad advised of the above, PCP offered to work patient in but being in no distress would be 12- 12:30 before seen patient declined visit.   Advised patient any new symptoms occur raped pulse, palpitation to be evaluated . Patient has appointment 06/01/21 to see PCP.

## 2021-03-20 NOTE — Telephone Encounter (Signed)
Refused sooner appointment.

## 2021-03-26 ENCOUNTER — Telehealth: Payer: Self-pay | Admitting: Urology

## 2021-03-26 NOTE — Telephone Encounter (Signed)
error 

## 2021-03-26 NOTE — Telephone Encounter (Signed)
Yes, ok to increase to 0.8mg  daily. Please keep scheduled follow-ups with [facility].

## 2021-03-26 NOTE — Telephone Encounter (Signed)
Patient called the office today to schedule follow up appointments in October.  He states that his tamsulsin 4mg  is not working. Patient is wanting to know if he can double up on the medication.   I advised him to wait until he hears back from our office.

## 2021-03-27 NOTE — Telephone Encounter (Signed)
Patient unavailable at time of call, ok per DPR, gave his father Mark Parsons the message. Advised him to have patient call office should he have any questions.

## 2021-03-30 ENCOUNTER — Telehealth: Payer: Self-pay | Admitting: Family Medicine

## 2021-03-30 NOTE — Telephone Encounter (Signed)
Informed patient of the evisit through mychart but he does not have it nor does he wish to set up. Patient has been made aware of the below.

## 2021-03-30 NOTE — Telephone Encounter (Signed)
Patient called and wanted to speak to a nurse about covid. He wanted to know how he would know if he had covid, he has cold symptoms. Patient was transferred to [place].

## 2021-03-30 NOTE — Telephone Encounter (Signed)
Noted.  He needs to quarantine at home until he has an evaluation with a provider and a COVID test.  He could potentially do a virtual visit through [place] if he has that ability and that could get him an evaluation prior to Monday if he would like.

## 2021-03-30 NOTE — Telephone Encounter (Signed)
Spoken to patient, he stated he disconnected the call from access nurse because all he wanted was an appointment. He is currently having a slight cough, fever, chills, and night sweats past two days. Patient stated he feels better today and just has the slight cough due to taking tylenol and motrin. Patient refused to go be seen at an UC/ED. He wanted to come into office and be seen. Informed him that he would need a Virtual appointment, he reluctantly agreed. Patient has appointment 04-02-21 @ 1430. Patient stated he did not want to go to UC/ED to wait for that long. He stated he will wait for appointment on Monday.

## 2021-04-02 ENCOUNTER — Telehealth: Payer: 59 | Admitting: Family

## 2021-04-02 ENCOUNTER — Telehealth: Payer: Self-pay

## 2021-04-02 ENCOUNTER — Other Ambulatory Visit: Payer: Self-pay | Admitting: Family Medicine

## 2021-04-02 DIAGNOSIS — I10 Essential (primary) hypertension: Secondary | ICD-10-CM

## 2021-04-02 NOTE — Telephone Encounter (Signed)
I tried calling patient twice as well as sent link to try to get patient started for virtual visit today at 2:30p. Was not able to LM die to VM being full.

## 2021-05-30 ENCOUNTER — Telehealth: Payer: Self-pay | Admitting: Family Medicine

## 2021-05-30 NOTE — Telephone Encounter (Signed)
Patient called in stating that he is sure that he had COVID in July and wants to come in to have a COVID test on 9/23 to check for antibodies instead of an appointment with Dr.Sonnenberg.Please advise.  Mavery Milling,cma

## 2021-05-30 NOTE — Telephone Encounter (Signed)
I called the patient and he is scheduled to be seen for kidney pain or chest pain since July.  ng

## 2021-05-30 NOTE — Telephone Encounter (Signed)
Patient called in stating that he is sure that he had COVID in July and wants to come in to have a COVID test on 9/23 to check for antibodies instead of an appointment with Dr.Sonnenberg.Please advise.

## 2021-05-30 NOTE — Telephone Encounter (Signed)
Patient is coming for an appointment on 06/01/21. He would like to have an xray before his appointment. He thinks he had covid  in July.

## 2021-05-30 NOTE — Telephone Encounter (Signed)
He does not currently have an appointment with me on 06/01/2021.  What makes him think he had COVID in July?  What symptoms did he have?  Is he still having symptoms?  We can certainly do an antibody test though that will not tell us whether or not he had COVID in July.  The antibody test will just tell us that he had an immune response to COVID at some point in the past.

## 2021-06-01 ENCOUNTER — Ambulatory Visit: Payer: 59 | Admitting: Family Medicine

## 2021-06-04 ENCOUNTER — Other Ambulatory Visit: Payer: Self-pay

## 2021-06-04 ENCOUNTER — Ambulatory Visit (INDEPENDENT_AMBULATORY_CARE_PROVIDER_SITE_OTHER): Payer: 59 | Admitting: Family Medicine

## 2021-06-04 ENCOUNTER — Ambulatory Visit (INDEPENDENT_AMBULATORY_CARE_PROVIDER_SITE_OTHER): Payer: 59

## 2021-06-04 ENCOUNTER — Encounter: Payer: Self-pay | Admitting: Family Medicine

## 2021-06-04 VITALS — BP 120/80 | HR 97 | Temp 98.3°F | Ht 70.0 in | Wt 216.0 lb

## 2021-06-04 DIAGNOSIS — Z23 Encounter for immunization: Secondary | ICD-10-CM

## 2021-06-04 DIAGNOSIS — G8929 Other chronic pain: Secondary | ICD-10-CM | POA: Diagnosis not present

## 2021-06-04 DIAGNOSIS — M546 Pain in thoracic spine: Secondary | ICD-10-CM

## 2021-06-04 DIAGNOSIS — E785 Hyperlipidemia, unspecified: Secondary | ICD-10-CM

## 2021-06-04 DIAGNOSIS — Z8709 Personal history of other diseases of the respiratory system: Secondary | ICD-10-CM

## 2021-06-04 DIAGNOSIS — R7303 Prediabetes: Secondary | ICD-10-CM

## 2021-06-04 DIAGNOSIS — E66811 Obesity, class 1: Secondary | ICD-10-CM | POA: Insufficient documentation

## 2021-06-04 DIAGNOSIS — N401 Enlarged prostate with lower urinary tract symptoms: Secondary | ICD-10-CM

## 2021-06-04 DIAGNOSIS — I10 Essential (primary) hypertension: Secondary | ICD-10-CM

## 2021-06-04 DIAGNOSIS — E669 Obesity, unspecified: Secondary | ICD-10-CM

## 2021-06-04 NOTE — Patient Instructions (Addendum)
Nice to see you. We are going to get some labs today. We will get an x-ray today as well. Please try to increase your activity level and work on your diet as we discussed.

## 2021-06-04 NOTE — Assessment & Plan Note (Signed)
I encouraged the patient to contact urology to discuss alternative treatments given that he is on max dose Flomax.

## 2021-06-04 NOTE — Assessment & Plan Note (Signed)
Patient potentially had COVID-19.  Discussed that there is no way to prove that it was COVID-19 at this point.  He will monitor for any persistent symptoms.

## 2021-06-04 NOTE — Progress Notes (Signed)
Mark Rumps, MD Phone: [phone]  Mark Graff Oronde Hallenbeck. is a 55 y.o. male who presents today for follow-up.  Respiratory illness: Patient reports he got sick in July.  He had fever, fatigue, and cough.  Felt as though his breathing was not 100%.  He develops back pain pain around that time.  He notes overall he has improved though he does continue to have some back pain which has been improving over the last several weeks.  No radiation.  No known COVID exposures though he felt as though he had COVID.  Hypertension: Noted similar to today.  He is taking lisinopril/HCTZ.  No chest pain or shortness of breath.  Obesity: Patient drinks regular Pepsi each day.  He eats a lot of stuff that he should not eat.  He eats a lot of fried foods as well as Posta.  He is not exercising much.  BPH: He is on tamsulosin 0.8 mg daily.  He notes he continues to pee frequently.  He gets up twice nightly.  He does have a strong urinary stream.  Social History   Tobacco Use  Smoking Status Never  Smokeless Tobacco Never    Current Outpatient Medications on File Prior to Visit  Medication Sig Dispense Refill   FLUoxetine (PROZAC) 40 MG capsule Take 1 capsule by mouth once daily 90 capsule 0   lisinopril-hydrochlorothiazide (ZESTORETIC) 20-25 MG tablet Take 1 tablet by mouth once daily 90 tablet 0   tadalafil (CIALIS) 20 MG tablet Take 1 tablet (20 mg total) by mouth daily as needed for erectile dysfunction. 30 tablet 3   tamsulosin (FLOMAX) 0.4 MG CAPS capsule Take 1 capsule (0.4 mg total) by mouth daily. 90 capsule 3   Testosterone (ANDROGEL) 20.25 MG/1.25GM (1.62%) GEL Apply 3 Pump topically daily. 300 g 5   No current facility-administered medications on file prior to visit.     ROS see history of present illness  Objective  Physical Exam Vitals:   06/04/21 1555  BP: 120/80  Pulse: 97  Temp: 98.3 F (36.8 C)  SpO2: 97%    BP Readings from Last 3 Encounters:  06/04/21 120/80   12/05/20 (!) 134/93  11/29/20 118/80   Wt Readings from Last 3 Encounters:  06/04/21 216 lb (98 kg)  12/05/20 212 lb (96.2 kg)  11/29/20 217 lb 12.8 oz (98.8 kg)    Physical Exam Constitutional:      General: He is not in acute distress.    Appearance: He is not diaphoretic.  Cardiovascular:     Rate and Rhythm: Normal rate and regular rhythm.     Heart sounds: Normal heart sounds.  Pulmonary:     Effort: Pulmonary effort is normal.     Breath sounds: Normal breath sounds.  Musculoskeletal:     Comments: There is slight lower thoracic midline spine tenderness, no midline spine step-off, there is slight lower thoracic muscular paraspinous tenderness, no overlying skin changes  Skin:    General: Skin is warm and dry.  Neurological:     Mental Status: He is alert.     Assessment/Plan: Please see individual problem list.  Problem List Items Addressed This Visit     Benign essential hypertension - Primary    Stable.  Continue to Zestoretic 1 tablet daily.  Check labs.      Relevant Orders   Comp Met (CMET)   Chronic midline thoracic back pain    Possibly related to inflammation from what ever viral illness he had back in  July.  Discussed x-ray today given persistent discomfort.      Relevant Orders   DG Thoracic Spine W/Swimmers   Enlarged prostate with lower urinary tract symptoms (LUTS)    I encouraged the patient to contact urology to discuss alternative treatments given that he is on max dose Flomax.      History of URI (upper respiratory infection)    Patient potentially had COVID-19.  Discussed that there is no way to prove that it was COVID-19 at this point.  He will monitor for any persistent symptoms.      HLD (hyperlipidemia)   Relevant Orders   Lipid panel   Comp Met (CMET)   Obesity (BMI 30.0-34.9)    Discussed increasing exercise with walking 2 to 3 days a week.  Discussed cutting down on soda and fried foods.  Discussed healthy weight loss is about  1 pound of weight loss per week.  Offered follow-up in several months for this issue though he deferred to follow-up in 6 months.      Prediabetes    Check A1c.      Relevant Orders   HgB A1c    Return in about 6 months (around 12/02/2021).  This visit occurred during the SARS-CoV-2 public health emergency.  Safety protocols were in place, including screening questions prior to the visit, additional usage of staff PPE, and extensive cleaning of exam room while observing appropriate contact time as indicated for disinfecting solutions.    Mark Rumps, MD [place]

## 2021-06-04 NOTE — Assessment & Plan Note (Addendum)
Discussed increasing exercise with walking 2 to 3 days a week.  Discussed cutting down on soda and fried foods.  Discussed healthy weight loss is about 1 pound of weight loss per week.  Offered follow-up in several months for this issue though he deferred to follow-up in 6 months.

## 2021-06-04 NOTE — Assessment & Plan Note (Signed)
Stable.  Continue to Zestoretic 1 tablet daily.  Check labs.

## 2021-06-04 NOTE — Assessment & Plan Note (Signed)
Check A1c. 

## 2021-06-04 NOTE — Assessment & Plan Note (Signed)
Possibly related to inflammation from what ever viral illness he had back in July.  Discussed x-ray today given persistent discomfort.

## 2021-06-05 LAB — COMPREHENSIVE METABOLIC PANEL
ALT: 33 U/L (ref 0–53)
AST: 22 U/L (ref 0–37)
Albumin: 4.4 g/dL (ref 3.5–5.2)
Alkaline Phosphatase: 61 U/L (ref 39–117)
BUN: 16 mg/dL (ref 6–23)
CO2: 27 mEq/L (ref 19–32)
Calcium: 9.7 mg/dL (ref 8.4–10.5)
Chloride: 98 mEq/L (ref 96–112)
Creatinine, Ser: 1 mg/dL (ref 0.40–1.50)
GFR: 85.54 mL/min (ref >60.00)
Glucose, Bld: 88 mg/dL (ref 70–99)
Potassium: 3.8 mEq/L (ref 3.5–5.1)
Sodium: 136 mEq/L (ref 135–145)
Total Bilirubin: 0.5 mg/dL (ref 0.2–1.2)
Total Protein: 7.4 g/dL (ref 6.0–8.3)

## 2021-06-05 LAB — LIPID PANEL
Cholesterol: 246 mg/dL — ABNORMAL HIGH (ref 0–200)
HDL: 46.2 mg/dL (ref >39.00)
Total CHOL/HDL Ratio: 5
Triglycerides: 438 mg/dL — ABNORMAL HIGH (ref 0.0–149.0)

## 2021-06-05 LAB — HEMOGLOBIN A1C: Hgb A1c MFr Bld: 5.7 % (ref 4.6–6.5)

## 2021-06-05 LAB — LDL CHOLESTEROL, DIRECT: Direct LDL: 161 mg/dL

## 2021-06-07 ENCOUNTER — Other Ambulatory Visit: Payer: Self-pay | Admitting: Family Medicine

## 2021-06-07 DIAGNOSIS — I10 Essential (primary) hypertension: Secondary | ICD-10-CM

## 2021-06-09 ENCOUNTER — Other Ambulatory Visit: Payer: Self-pay | Admitting: Family Medicine

## 2021-06-09 DIAGNOSIS — E785 Hyperlipidemia, unspecified: Secondary | ICD-10-CM

## 2021-06-09 MED ORDER — ROSUVASTATIN CALCIUM 20 MG PO TABS: 20.0000 mg | ORAL_TABLET | Freq: Every day | ORAL | 3 refills | Status: DC

## 2021-07-05 ENCOUNTER — Other Ambulatory Visit: Payer: 59

## 2021-07-05 ENCOUNTER — Telehealth: Payer: Self-pay | Admitting: Family Medicine

## 2021-07-05 ENCOUNTER — Other Ambulatory Visit: Payer: Self-pay

## 2021-07-05 DIAGNOSIS — N401 Enlarged prostate with lower urinary tract symptoms: Secondary | ICD-10-CM

## 2021-07-05 DIAGNOSIS — E349 Endocrine disorder, unspecified: Secondary | ICD-10-CM

## 2021-07-05 DIAGNOSIS — N138 Other obstructive and reflux uropathy: Secondary | ICD-10-CM

## 2021-07-05 NOTE — Telephone Encounter (Signed)
Noted.  If they are not able to work this up for him he should let us know.

## 2021-07-05 NOTE — Telephone Encounter (Signed)
Patient wants to come in and give a urine sample. He thinks he has a kidney infection. Did not want to be sent to triage. He said he would just walk into the office tomorrow.

## 2021-07-05 NOTE — Telephone Encounter (Signed)
I have tried to call patient on both numbers. One is busy the other the VM is full. I held 1:15p slot on Monday for patient. I was going to see if okay to leave urine sample tomorrow? I can order culture & UA?

## 2021-07-05 NOTE — Telephone Encounter (Signed)
Called patient he is having symptoms of pressure and frequent urination and hurting in his back, I saw patient having labs done t urology today I advised him to let his urologist know the symptoms he is having and to see if they can add these labs for him since this is a urological problem he is having.Patient agreed and said " that makes since so he is calling urology".

## 2021-07-06 LAB — TESTOSTERONE: Testosterone: 326 ng/dL (ref 264–916)

## 2021-07-06 LAB — HEMOGLOBIN AND HEMATOCRIT, BLOOD
Hematocrit: 49.3 % (ref 37.5–51.0)
Hemoglobin: 16.9 g/dL (ref 13.0–17.7)

## 2021-07-06 LAB — PSA: Prostate Specific Ag, Serum: 0.5 ng/mL (ref 0.0–4.0)

## 2021-07-09 ENCOUNTER — Other Ambulatory Visit: Payer: Self-pay | Admitting: Family Medicine

## 2021-07-09 NOTE — Progress Notes (Signed)
11/25/2019 3:59 PM   Mark Parsons. 04-12-1966 [id number]  Referring provider: Leone Haven, MD [address] [place],  [place] [address]  Chief Complaint  Patient presents with   Benign Prostatic Hypertrophy    Urological history: 1. Testosterone deficiency -contributing factors of age, hyperglycemia, obesity, chronic pain medication and alcohol abuse. -testosterone 326 in 06/2021 -H & H WNL in 06/2021 -managed with testosterone topical gel 1.62%, 2 pumps  2. Erythrocytosis -managed with periodic phlebotomy  3. BPH with LU TS -PSA 0.5 in 06/2021  -I PSS 9/3 -PVR 27 mL in 11/2020 -managed with tamsulosin 0.4 mg, two capsules daily  4. ED -contributory factors of age, BPH, testosterone deficiency, Peyronie's disease, alcohol abuse, smoking, antidepressants and pain medication  -SHIM 1 -managed with tadalafil 20 mg, on-demand-dosing  5. Peyronie's disease -underwent interferon injections ~ 10 years ago   HPI: Mark Parsons. is a 55 y.o. male who presents today for follow up.   He is having urinary frequency.  Patient denies any modifying or aggravating factors.  Patient denies any gross hematuria, dysuria or suprapubic/flank pain.  Patient denies any fevers, chills, nausea or vomiting.     IPSS     Row Name 07/10/21 1500         International Prostate Symptom Score   How often have you had the sensation of not emptying your bladder? Not at All     How often have you had to urinate less than every two hours? More than half the time     How often have you found you stopped and started again several times when you urinated? Not at All     How often have you found it difficult to postpone urination? About half the time     How often have you had a weak urinary stream? Not at All     How often have you had to strain to start urination? Not at All     How many times did you typically get up at night to urinate? 2 Times     Total IPSS Score 9        Quality of Life due to urinary symptoms   If you were to spend the rest of your life with your urinary condition just the way it is now how would you feel about that? Mixed               Score:  1-7 Mild 8-19 Moderate 20-35 Severe  Patient is not having spontaneous erections.  He denies any pain with erections.  He is having curvature with erections.     SHIM     Row Name 07/10/21 1539         SHIM: Over the last 6 months:   How do you rate your confidence that you could get and keep an erection? Very Low     When you had erections with sexual stimulation, how often were your erections hard enough for penetration (entering your partner)? No Sexual Activity     During sexual intercourse, how often were you able to maintain your erection after you had penetrated (entered) your partner? No Sexual Activity     During sexual intercourse, how difficult was it to maintain your erection to completion of intercourse? Did not attempt intercourse     When you attempted sexual intercourse, how often was it satisfactory for you? Did not attempt intercourse       SHIM Total Score  SHIM 1               Score: 1-7 Severe ED 8-11 Moderate ED 12-16 Mild-Moderate ED 17-21 Mild ED 22-25 No ED    PMH: Past Medical History:  Diagnosis Date   Depression    Hypertension    Low testosterone in male     Surgical History: History reviewed. No pertinent surgical history.  Home Medications:  Allergies as of 07/10/2021       Reactions   Propofol Anaphylaxis   Near death        Medication List        Accurate as of July 10, 2021  3:59 PM. If you have any questions, ask your nurse or doctor.          FLUoxetine 40 MG capsule Commonly known as: PROZAC Take 1 capsule by mouth once daily   lisinopril-hydrochlorothiazide 20-25 MG tablet Commonly known as: ZESTORETIC Take 1 tablet by mouth once daily   rosuvastatin 20 MG tablet Commonly known as:  Crestor Take 1 tablet (20 mg total) by mouth daily.   tadalafil 20 MG tablet Commonly known as: CIALIS Take 1 tablet (20 mg total) by mouth daily as needed for erectile dysfunction.   tamsulosin 0.4 MG Caps capsule Commonly known as: FLOMAX Take 1 capsule (0.4 mg total) by mouth daily.   Testosterone 20.25 MG/1.25GM (1.62%) Gel Commonly known as: AndroGel Apply 3 Pump topically daily.        Allergies:  Allergies  Allergen Reactions   Propofol Anaphylaxis    Near death    Family History: Family History  Problem Relation Age of Onset   Colon cancer Mother    Multiple myeloma Father    Aortic aneurysm Father    Stroke Father     Social History:  reports that he has never smoked. He has never used smokeless tobacco. He reports current alcohol use of about 40.0 standard drinks per week. He reports that he does not use drugs.  ROS: For pertinent review of systems please refer to history of present illness  Physical Exam: BP (!) 157/109   Pulse (!) 106   Ht _0  (1.778 m)   Wt 213 lb (96.6 kg)   BMI 30.56 kg/m   Constitutional:  Well nourished. Alert and oriented, No acute distress. HEENT: [place] AT, mask in place.  Trachea midline Cardiovascular: No clubbing, cyanosis, or edema. Respiratory: Normal respiratory effort, no increased work of breathing. Neurologic: Grossly intact, no focal deficits, moving all 4 extremities. Psychiatric: Normal mood and affect.   Laboratory Data Results for orders placed or performed in visit on 07/05/21  Hemoglobin and hematocrit, blood  Result Value Ref Range   Hemoglobin 16.9 13.0 - 17.7 g/dL   Hematocrit 49.3 37.5 - 51.0 %  Testosterone  Result Value Ref Range   Testosterone 326 264 - 916 ng/dL  PSA  Result Value Ref Range   Prostate Specific Ag, Serum 0.5 0.0 - 4.0 ng/mL   Urinalysis Component     Latest Ref Rng & Units 07/10/2021  Specific Gravity, UA     1.005 - 1.030 1.025  pH, UA     5.0 - 7.5 6.0  Color, UA      Yellow Yellow  Appearance Ur     Clear Clear  Leukocytes,UA     Negative Negative  Protein,UA     Negative/Trace 2+ (A)  Glucose, UA     Negative Negative  Ketones, UA  Negative Trace (A)  RBC, UA     Negative Negative  Bilirubin, UA     Negative Negative  Urobilinogen, Ur     0.2 - 1.0 mg/dL 0.2  Nitrite, UA     Negative Negative  Microscopic Examination      See below:   Component     Latest Ref Rng & Units 07/10/2021  WBC, UA     0 - 5 /hpf None seen  RBC     0 - 2 /hpf 0-2  Epithelial Cells (non renal)     0 - 10 /hpf None seen  Bacteria, UA     None seen/Few Few (A)  I have reviewed the labs.   Assessment & Plan:    1. Testosterone deficiency -testosterone still sub therapeutic  -He has only been applying 2 pumps of the AndroGel daily instead of the 3 as directed -He will not begin 3 pumps of AndroGel daily and we will recheck the testosterone in 1 month  2. BPH with LUTS -PSA stable -DRE benign -UA benign -PVR < 300 cc -symptoms - frequency --continue conservative management, avoiding bladder irritants and timed voiding's -Continue two tamsulosin 0.4 mg daily  3. 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 Peyronie's diease -Stable -Continue conservative management  -May want to consider Xiaflex if curve worsens  Return in 1 month for serum testosterone level 2 to 4 hours after applying gel, hemoglobin hematocrit  Zara Council, PA-C  [facility] [address], [place], [place] [address] [phone]

## 2021-07-10 ENCOUNTER — Ambulatory Visit (INDEPENDENT_AMBULATORY_CARE_PROVIDER_SITE_OTHER): Payer: 59 | Admitting: Urology

## 2021-07-10 ENCOUNTER — Encounter: Payer: Self-pay | Admitting: Urology

## 2021-07-10 ENCOUNTER — Other Ambulatory Visit: Payer: Self-pay

## 2021-07-10 VITALS — BP 157/109 | HR 106 | Ht 70.0 in | Wt 213.0 lb

## 2021-07-10 DIAGNOSIS — N5201 Erectile dysfunction due to arterial insufficiency: Secondary | ICD-10-CM | POA: Diagnosis not present

## 2021-07-10 DIAGNOSIS — N486 Induration penis plastica: Secondary | ICD-10-CM | POA: Diagnosis not present

## 2021-07-10 DIAGNOSIS — E349 Endocrine disorder, unspecified: Secondary | ICD-10-CM | POA: Diagnosis not present

## 2021-07-10 DIAGNOSIS — N138 Other obstructive and reflux uropathy: Secondary | ICD-10-CM

## 2021-07-10 DIAGNOSIS — N401 Enlarged prostate with lower urinary tract symptoms: Secondary | ICD-10-CM

## 2021-07-10 LAB — URINALYSIS, COMPLETE
Bilirubin, UA: NEGATIVE
Glucose, UA: NEGATIVE
Leukocytes,UA: NEGATIVE
Nitrite, UA: NEGATIVE
RBC, UA: NEGATIVE
Specific Gravity, UA: 1.025 (ref 1.005–1.030)
Urobilinogen, Ur: 0.2 mg/dL (ref 0.2–1.0)
pH, UA: 6 (ref 5.0–7.5)

## 2021-07-10 LAB — MICROSCOPIC EXAMINATION
Epithelial Cells (non renal): NONE SEEN /hpf (ref 0–10)
WBC, UA: NONE SEEN /hpf (ref 0–5)

## 2021-07-23 ENCOUNTER — Other Ambulatory Visit: Payer: 59

## 2021-07-23 ENCOUNTER — Ambulatory Visit: Payer: 59 | Admitting: Family Medicine

## 2021-08-09 ENCOUNTER — Other Ambulatory Visit: Payer: 59

## 2021-08-09 IMAGING — DX DG KNEE COMPLETE 4+V*L*
5 series · 5 of 5 positions shown · non-contrast
Comparison: None

CLINICAL DATA: BILATERAL knee pain for 3-4 years

EXAM:
LEFT KNEE - COMPLETE 4+ VIEW

[knee standing ap]
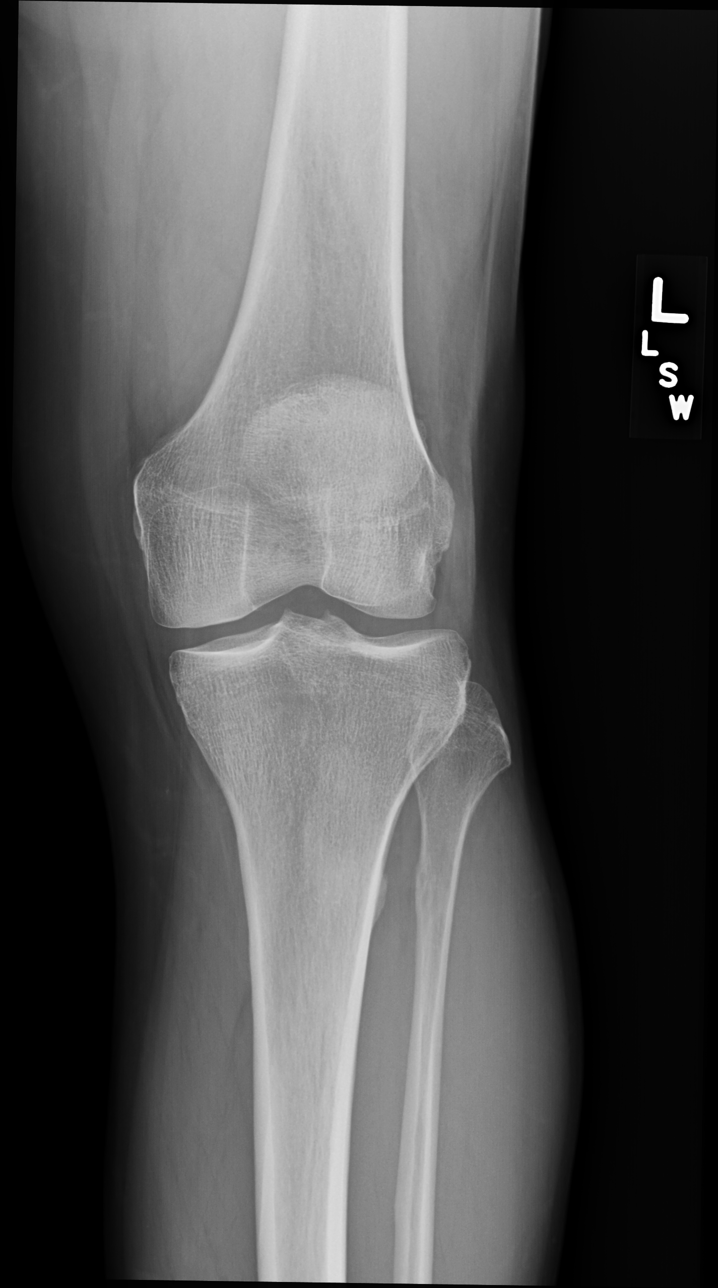

[knee standing external ap]
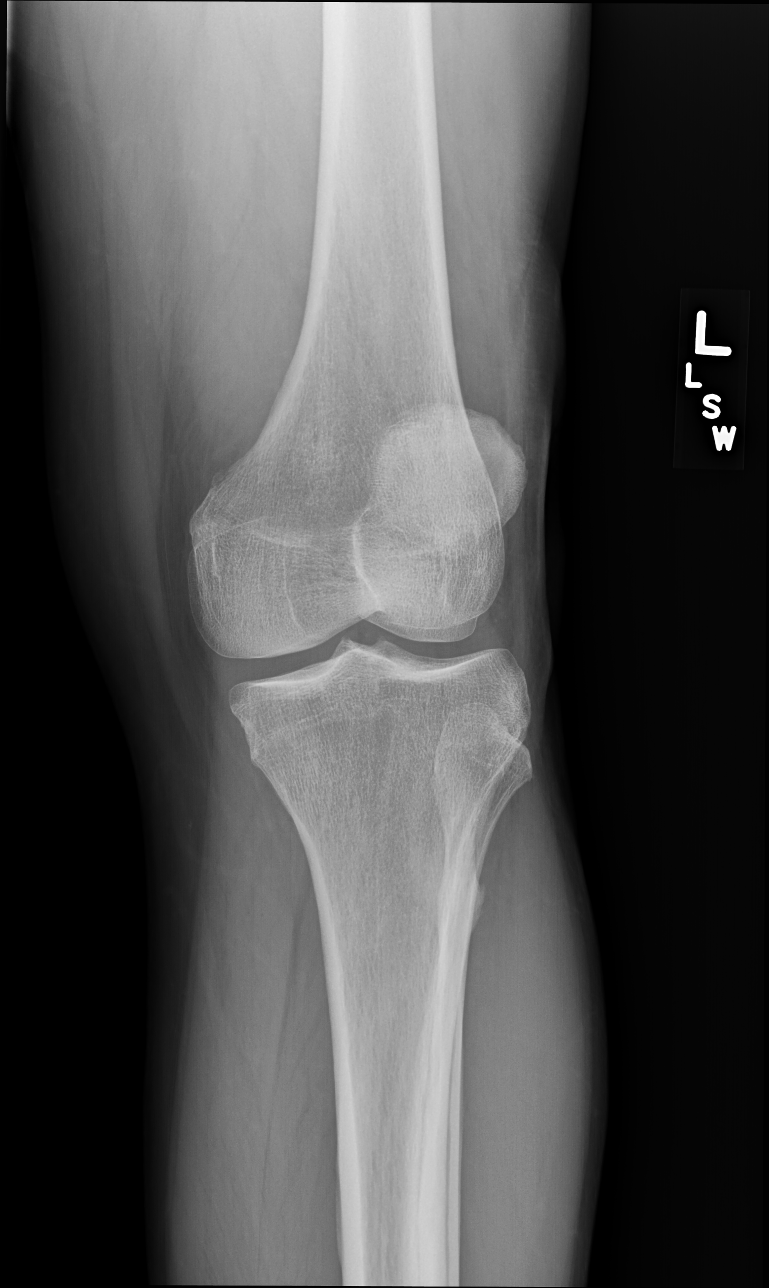

[knee standing internal ap]
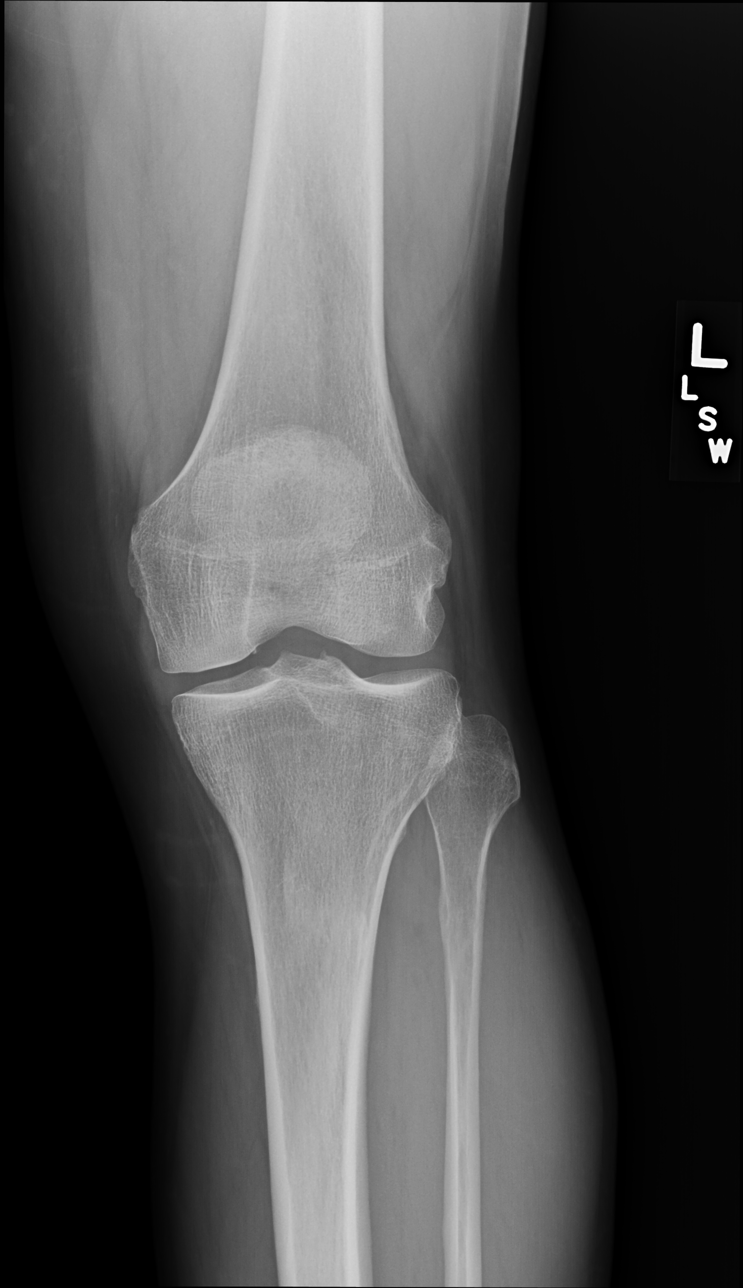

[knee standing lat]
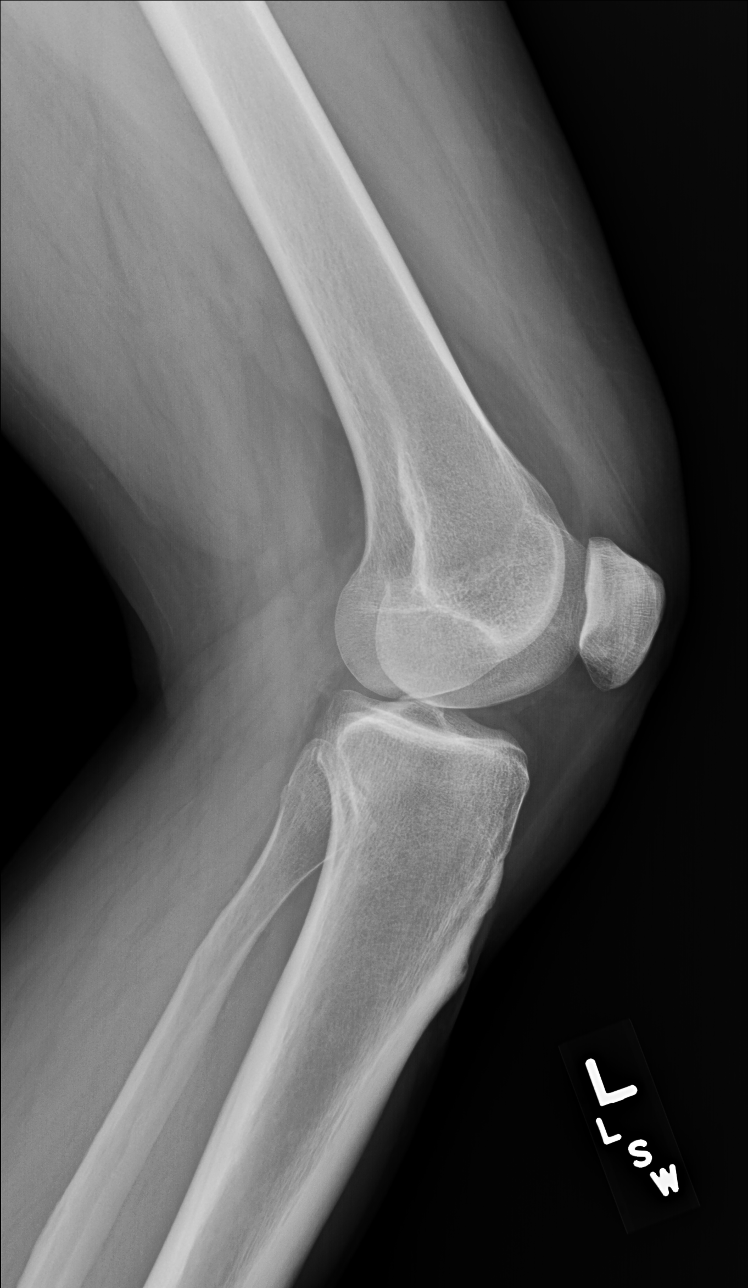

[knee [person_name] view pa]
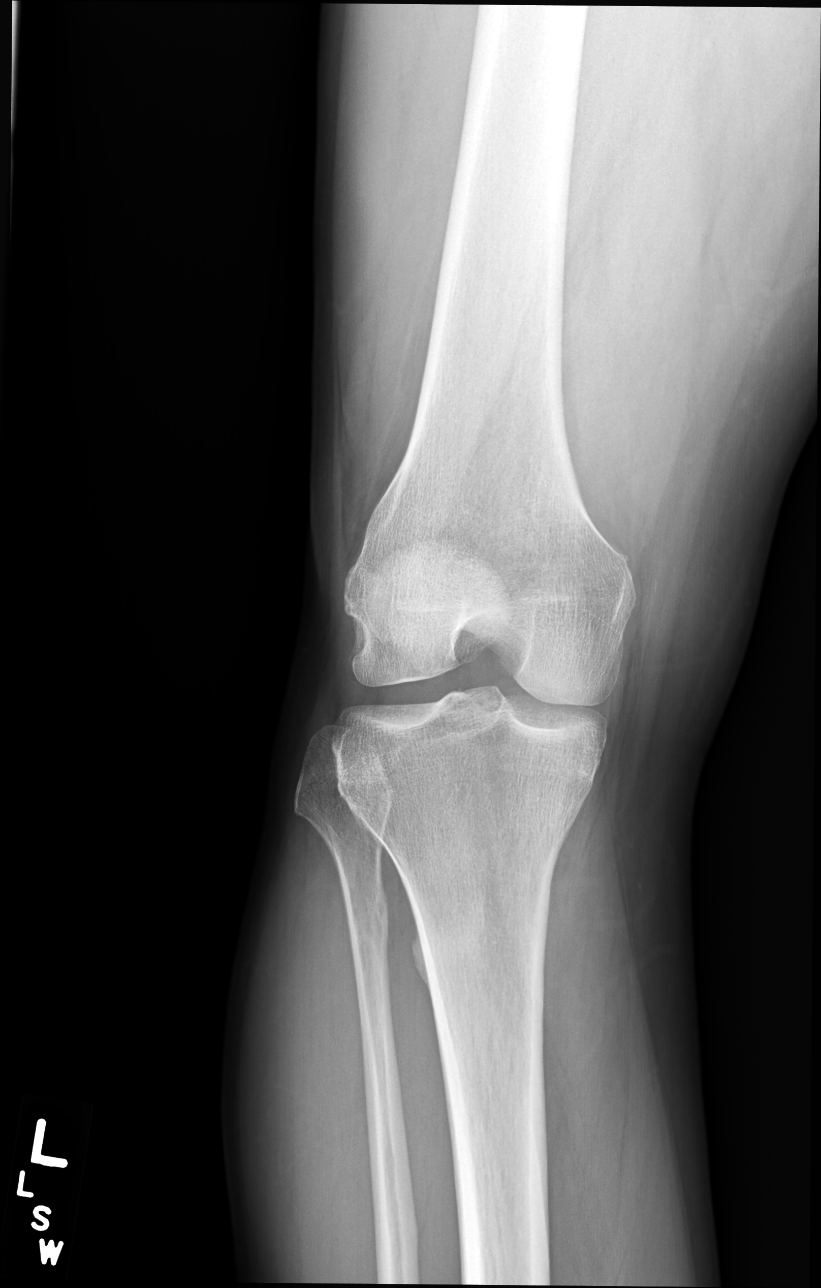

[5 of 5 positions shown; findings below may reference images not displayed]

FINDINGS: Osseous mineralization normal.

Joint spaces preserved.

No acute fracture, dislocation, or bone destruction.

No joint effusion.
IMPRESSION: Normal exam.

## 2021-08-10 ENCOUNTER — Encounter: Payer: Self-pay | Admitting: Urology

## 2021-08-30 ENCOUNTER — Other Ambulatory Visit: Payer: 59

## 2021-08-30 ENCOUNTER — Other Ambulatory Visit: Payer: Self-pay

## 2021-08-30 ENCOUNTER — Other Ambulatory Visit: Payer: Self-pay | Admitting: Urology

## 2021-08-30 ENCOUNTER — Telehealth: Payer: Self-pay | Admitting: Urology

## 2021-08-30 DIAGNOSIS — E349 Endocrine disorder, unspecified: Secondary | ICD-10-CM

## 2021-08-30 DIAGNOSIS — N401 Enlarged prostate with lower urinary tract symptoms: Secondary | ICD-10-CM

## 2021-08-30 MED ORDER — TAMSULOSIN HCL 0.4 MG PO CAPS: 0.8000 mg | ORAL_CAPSULE | Freq: Every day | ORAL | 3 refills | Status: DC

## 2021-08-30 NOTE — Telephone Encounter (Signed)
Pt came in for labs today and needs a refill for Tamsulosin and Androgel.  He said he would like to get before Christmas

## 2021-08-31 ENCOUNTER — Other Ambulatory Visit: Payer: Self-pay | Admitting: Urology

## 2021-08-31 DIAGNOSIS — E349 Endocrine disorder, unspecified: Secondary | ICD-10-CM

## 2021-08-31 LAB — TESTOSTERONE: Testosterone: 406 ng/dL (ref 264–916)

## 2021-08-31 MED ORDER — TESTOSTERONE 20.25 MG/1.25GM (1.62%) TD GEL: 3.0000 | Freq: Every day | TRANSDERMAL | 1 refills | Status: DC

## 2021-09-05 ENCOUNTER — Other Ambulatory Visit: Payer: Self-pay | Admitting: *Deleted

## 2021-09-05 DIAGNOSIS — E349 Endocrine disorder, unspecified: Secondary | ICD-10-CM

## 2021-09-05 DIAGNOSIS — N5201 Erectile dysfunction due to arterial insufficiency: Secondary | ICD-10-CM

## 2021-09-05 DIAGNOSIS — N401 Enlarged prostate with lower urinary tract symptoms: Secondary | ICD-10-CM

## 2021-09-06 ENCOUNTER — Encounter: Payer: Self-pay | Admitting: Oncology

## 2021-09-26 ENCOUNTER — Other Ambulatory Visit: Payer: Self-pay | Admitting: Family Medicine

## 2021-09-26 DIAGNOSIS — I10 Essential (primary) hypertension: Secondary | ICD-10-CM

## 2021-10-04 ENCOUNTER — Other Ambulatory Visit: Payer: Self-pay

## 2021-10-04 ENCOUNTER — Encounter: Payer: Self-pay | Admitting: Adult Health

## 2021-10-04 ENCOUNTER — Encounter: Payer: Self-pay | Admitting: Oncology

## 2021-10-04 ENCOUNTER — Ambulatory Visit (INDEPENDENT_AMBULATORY_CARE_PROVIDER_SITE_OTHER): Payer: 59 | Admitting: Adult Health

## 2021-10-04 VITALS — BP 150/82 | HR 71 | Temp 99.0°F | Ht 70.0 in | Wt 212.2 lb

## 2021-10-04 DIAGNOSIS — R197 Diarrhea, unspecified: Secondary | ICD-10-CM

## 2021-10-04 DIAGNOSIS — K219 Gastro-esophageal reflux disease without esophagitis: Secondary | ICD-10-CM | POA: Insufficient documentation

## 2021-10-04 LAB — CBC WITH DIFFERENTIAL/PLATELET
Basophils Absolute: 0 10*3/uL (ref 0.0–0.1)
Basophils Relative: 0.6 % (ref 0.0–3.0)
Eosinophils Absolute: 0.6 10*3/uL (ref 0.0–0.7)
Eosinophils Relative: 9.3 % — ABNORMAL HIGH (ref 0.0–5.0)
HCT: 48.3 % (ref 39.0–52.0)
Hemoglobin: 16 g/dL (ref 13.0–17.0)
Lymphocytes Relative: 19.9 % (ref 12.0–46.0)
Lymphs Abs: 1.4 10*3/uL (ref 0.7–4.0)
MCHC: 33.1 g/dL (ref 30.0–36.0)
MCV: 94.2 fl (ref 78.0–100.0)
Monocytes Absolute: 0.7 10*3/uL (ref 0.1–1.0)
Monocytes Relative: 9.6 % (ref 3.0–12.0)
Neutro Abs: 4.1 10*3/uL (ref 1.4–7.7)
Neutrophils Relative %: 60.6 % (ref 43.0–77.0)
Platelets: 232 10*3/uL (ref 150.0–400.0)
RBC: 5.13 Mil/uL (ref 4.22–5.81)
RDW: 13.9 % (ref 11.5–15.5)
WBC: 6.8 10*3/uL (ref 4.0–10.5)

## 2021-10-04 LAB — COMPREHENSIVE METABOLIC PANEL
ALT: 26 U/L (ref 0–53)
AST: 18 U/L (ref 0–37)
Albumin: 4.1 g/dL (ref 3.5–5.2)
Alkaline Phosphatase: 73 U/L (ref 39–117)
BUN: 14 mg/dL (ref 6–23)
CO2: 28 mEq/L (ref 19–32)
Calcium: 9.2 mg/dL (ref 8.4–10.5)
Chloride: 100 mEq/L (ref 96–112)
Creatinine, Ser: 0.89 mg/dL (ref 0.40–1.50)
GFR: 97.17 mL/min (ref >60.00)
Glucose, Bld: 107 mg/dL — ABNORMAL HIGH (ref 70–99)
Potassium: 3.5 mEq/L (ref 3.5–5.1)
Sodium: 136 mEq/L (ref 135–145)
Total Bilirubin: 0.5 mg/dL (ref 0.2–1.2)
Total Protein: 7.3 g/dL (ref 6.0–8.3)

## 2021-10-04 LAB — LIPASE: Lipase: 24 U/L (ref 11.0–59.0)

## 2021-10-04 LAB — AMYLASE: Amylase: 25 U/L — ABNORMAL LOW (ref 27–131)

## 2021-10-04 MED ORDER — SUCRALFATE 1 G PO TABS: 1.0000 g | ORAL_TABLET | Freq: Three times a day (TID) | ORAL | 0 refills | Status: DC

## 2021-10-04 MED ORDER — OMEPRAZOLE 40 MG PO CPDR: 40.0000 mg | DELAYED_RELEASE_CAPSULE | Freq: Every day | ORAL | 3 refills | Status: DC

## 2021-10-04 NOTE — Progress Notes (Signed)
CMP, ok non fasting elevated glucose mild.  No anemia in CBC - eosinophils elevated can sometimes be with inflammation/ allergies, recheck CBC in 3 weeks advised.  Lipase within normal.  Amylase slightly low.  Keep gastroenterology visit when they call to schedule is advised.

## 2021-10-04 NOTE — Progress Notes (Signed)
° °Acute Office Visit ° °Subjective:  ° ° Patient ID: Mark L Leaming Jr., male    DOB: 04/23/1966, 56 y.o.   MRN: [id number] ° °Chief Complaint  °Patient presents with  ° possible stomach ulcer  ° ° °HPI °Patient is in today for concerns for nausea and diarrhea for about 1 month he reports. He is having diarrhea watery 4- 5 times a day/  °No antibiotic use.  °Is on testosterone.  °Denies any steady Motrin use. He did take one the other day.  °Denies any blood in stools or dark tarry stools.  °He did have vomiting x 1 this weekend, acid gagging.  °Mom 83 had bowel obstruction history of colon cancer at age 78- 79, ° ° °Last colonoscopy was 08/09/2016 at [facility]. Was normal.  ° ° °Patient  denies any fever, body aches,chills, rash, chest pain, shortness of breath, nausea.  °Denies dizziness, lightheadedness, pre syncopal or syncopal episodes.  °Denies any palpitations, neck pain.  °Feels well otherwise  ° ° °Past Medical History:  °Diagnosis Date  ° Depression   ° Hypertension   ° Low testosterone in male   ° ° °No past surgical history on file. ° °Family History  °Problem Relation Age of Onset  ° Colon cancer Mother   ° Multiple myeloma Father   ° Aortic aneurysm Father   ° Stroke Father   ° ° °Social History  ° °Socioeconomic History  ° Marital status: Single  °  Spouse name: Not on file  ° Number of children: Not on file  ° Years of education: Not on file  ° Highest education level: Not on file  °Occupational History  ° Not on file  °Tobacco Use  ° Smoking status: Never  ° Smokeless tobacco: Never  °Vaping Use  ° Vaping Use: Never used  °Substance and Sexual Activity  ° Alcohol use: Yes  °  Alcohol/week: 40.0 standard drinks  °  Types: 40 Cans of beer per week  ° Drug use: No  ° Sexual activity: Not Currently  °Other Topics Concern  ° Not on file  °Social History Narrative  ° Not on file  ° °Social Determinants of Health  ° °Financial Resource Strain: Not on file  °Food Insecurity: Not on file   °Transportation Needs: Not on file  °Physical Activity: Not on file  °Stress: Not on file  °Social Connections: Not on file  °Intimate Partner Violence: Not on file  ° ° °Outpatient Medications Prior to Visit  °Medication Sig Dispense Refill  ° FLUoxetine (PROZAC) 40 MG capsule Take 1 capsule by mouth once daily 90 capsule 1  ° lisinopril-hydrochlorothiazide (ZESTORETIC) 20-25 MG tablet Take 1 tablet by mouth once daily 90 tablet 1  ° rosuvastatin (CRESTOR) 20 MG tablet Take 1 tablet (20 mg total) by mouth daily. 90 tablet 3  ° tadalafil (CIALIS) 20 MG tablet Take 1 tablet (20 mg total) by mouth daily as needed for erectile dysfunction. 30 tablet 3  ° tamsulosin (FLOMAX) 0.4 MG CAPS capsule Take 2 capsules (0.8 mg total) by mouth daily. 180 capsule 3  ° Testosterone (ANDROGEL) 20.25 MG/1.25GM (1.62%) GEL Apply 3 Pump topically daily. 300 g 1  ° Testosterone 1.62 % GEL SMARTSIG:3 Pump Topical Daily    ° cimetidine (TAGAMET) 200 MG tablet Take 200 mg by mouth daily as needed.    ° °No facility-administered medications prior to visit.  ° ° °Allergies  °Allergen Reactions  ° Propofol Anaphylaxis  °  Near   Near death    Review of Systems  Constitutional: Negative.   HENT: Negative.    Respiratory: Negative.    Cardiovascular: Negative.   Gastrointestinal:  Positive for abdominal pain, diarrhea, nausea and vomiting. Negative for abdominal distention, anal bleeding, blood in stool, constipation and rectal pain.  Genitourinary: Negative.   Musculoskeletal: Negative.   Skin: Negative.   Neurological: Negative.   Psychiatric/Behavioral: Negative.        Objective:    Physical Exam Constitutional:      General: He is not in acute distress.    Appearance: He is well-developed.  HENT:     Head: Normocephalic and atraumatic.     Comments: Pale skin     Right Ear: External ear normal.     Left Ear: External ear normal.  Eyes:     Conjunctiva/sclera: Conjunctivae normal.     Pupils: Pupils are equal, round,  and reactive to light.  Cardiovascular:     Rate and Rhythm: Normal rate and regular rhythm.     Heart sounds: Normal heart sounds.  Pulmonary:     Effort: Pulmonary effort is normal.     Breath sounds: Normal breath sounds.  Abdominal:     General: Bowel sounds are normal. There is no distension.     Palpations: Abdomen is soft. There is no mass.     Tenderness: There is abdominal tenderness in the epigastric area. There is no right CVA tenderness, left CVA tenderness, guarding or rebound.     Hernia: No hernia is present.     Comments: With deep palpation epigastrium   Musculoskeletal:        General: Normal range of motion.     Cervical back: Normal range of motion and neck supple.  Skin:    General: Skin is warm and dry.  Neurological:     Mental Status: He is alert and oriented to person, place, and time.  Psychiatric:        Behavior: Behavior normal.        Thought Content: Thought content normal.        Judgment: Judgment normal.    BP (!) 150/82 (BP Location: Left Arm, Patient Position: Sitting, Cuff Size: Large)    Pulse 71    Temp 99 F (37.2 C) (Oral)    Ht 5' 10" (1.778 m)    Wt 212 lb 3.2 oz (96.3 kg)    SpO2 97%    BMI 30.45 kg/m  Wt Readings from Last 3 Encounters:  10/04/21 212 lb 3.2 oz (96.3 kg)  07/10/21 213 lb (96.6 kg)  06/04/21 216 lb (98 kg)    Health Maintenance Due  Topic Date Due   HIV Screening  Never done   Hepatitis C Screening  Never done   Zoster Vaccines- Shingrix (1 of 2) Never done   COVID-19 Vaccine (3 - Booster for Pfizer series) 03/14/2020   COLONOSCOPY (Pts 45-68yr Insurance coverage will need to be confirmed)  08/09/2021    There are no preventive care reminders to display for this patient.   No results found for: TSH Lab Results  Component Value Date   WBC 6.8 10/04/2021   HGB 16.0 10/04/2021   HCT 48.3 10/04/2021   MCV 94.2 10/04/2021   PLT 232.0 10/04/2021   Lab Results  Component Value Date   NA 136 10/04/2021   K  3.5 10/04/2021   CO2 28 10/04/2021   GLUCOSE 107 (H) 10/04/2021   BUN 14 10/04/2021   CREATININE  0.89 10/04/2021   BILITOT 0.5 10/04/2021   ALKPHOS 73 10/04/2021   AST 18 10/04/2021   ALT 26 10/04/2021   PROT 7.3 10/04/2021   ALBUMIN 4.1 10/04/2021   CALCIUM 9.2 10/04/2021   ANIONGAP 5 (L) 09/24/2013   GFR 97.17 10/04/2021   Lab Results  Component Value Date   CHOL 246 (H) 06/04/2021   Lab Results  Component Value Date   HDL 46.20 06/04/2021   Lab Results  Component Value Date   [facility]  05/05/2020     Comment:     . LDL cholesterol not calculated. Triglyceride levels greater than 400 mg/dL invalidate calculated LDL results. . Reference range: <100 . Desirable range <100 mg/dL for primary prevention;   <70 mg/dL for patients with CHD or diabetic patients  with > or = 2 CHD risk factors. Marland Kitchen LDL-C is now calculated using the Martin-Hopkins  calculation, which is a validated novel method providing  better accuracy than the Friedewald equation in the  estimation of LDL-C.  Cresenciano Genre et al. Annamaria Helling. 8338;250(53): 2061-2068  (http://education.[employer].com/faq/FAQ164)    Lab Results  Component Value Date   TRIG (H) 06/04/2021    438.0 Triglyceride is over 400; calculations on Lipids are invalid.   Lab Results  Component Value Date   CHOLHDL 5 06/04/2021   Lab Results  Component Value Date   HGBA1C 5.7 06/04/2021       Assessment & Plan:   Problem List Items Addressed This Visit       Digestive   Gastroesophageal reflux disease - Primary   Relevant Medications   omeprazole (PRILOSEC) 40 MG capsule   sucralfate (CARAFATE) 1 g tablet   Other Relevant Orders   CBC with Differential/Platelet (Completed)   Comprehensive metabolic panel (Completed)   Amylase (Completed)   Lipase (Completed)   Fecal occult blood, imunochemical   GI pathogen panel by PCR, stool   Ambulatory referral to Gastroenterology     Other   Diarrhea   Relevant Orders    Fecal occult blood, imunochemical   GI pathogen panel by PCR, stool     Meds ordered this encounter  Medications   omeprazole (PRILOSEC) 40 MG capsule    Sig: Take 1 capsule (40 mg total) by mouth daily.    Dispense:  30 capsule    Refill:  3   sucralfate (CARAFATE) 1 g tablet    Sig: Take 1 tablet (1 g total) by mouth 4 (four) times daily -  with meals and at bedtime for 12 days.    Dispense:  48 tablet    Refill:  0   Medications Discontinued During This Encounter  Medication Reason   cimetidine (TAGAMET) 200 MG tablet Completed Course      Red Flags discussed. The patient was given clear instructions to go to ER or return to medical center if any red flags develop, symptoms do not improve, worsen or new problems develop. They verbalized understanding.  Return in about 1 week (around 10/11/2021), or if symptoms worsen or fail to improve, for Go to Emergency room/ urgent care if worse, at any time for any worsening symptoms.   Marcille Buffy, FNP

## 2021-10-04 NOTE — Patient Instructions (Addendum)
Gastroesophageal Reflux Disease, Adult Gastroesophageal reflux (GER) happens when acid from the stomach flows up into the tube that connects the mouth and the stomach (esophagus). Normally, food travels down the esophagus and stays in the stomach to be digested. However, when a person has GER, food and stomach acid sometimes move back up into the esophagus. If this becomes a more serious problem, the person may be diagnosed with a disease called gastroesophageal reflux disease (GERD). GERD occurs when the reflux: Happens often. Causes frequent or severe symptoms. Causes problems such as damage to the esophagus. When stomach acid comes in contact with the esophagus, the acid may cause inflammation in the esophagus. Over time, GERD may create small holes (ulcers) in the lining of the esophagus. What are the causes? This condition is caused by a problem with the muscle between the esophagus and the stomach (lower esophageal sphincter, or LES). Normally, the LES muscle closes after food passes through the esophagus to the stomach. When the LES is weakened or abnormal, it does not close properly, and that allows food and stomach acid to go back up into the esophagus. The LES can be weakened by certain dietary substances, medicines, and medical conditions, including: Tobacco use. Pregnancy. Having a hiatal hernia. Alcohol use. Certain foods and beverages, such as coffee, chocolate, onions, and peppermint. What increases the risk? You are more likely to develop this condition if you: Have an increased body weight. Have a connective tissue disorder. Take NSAIDs, such as ibuprofen. What are the signs or symptoms? Symptoms of this condition include: Heartburn. Difficult or painful swallowing and the feeling of having a lump in the throat. A bitter taste in the mouth. Bad breath and having a large amount of saliva. Having an upset or bloated stomach and belching. Chest pain. Different conditions  can cause chest pain. Make sure you see your health care provider if you experience chest pain. Shortness of breath or wheezing. Ongoing (chronic) cough or a nighttime cough. Wearing away of tooth enamel. Weight loss. How is this diagnosed? This condition may be diagnosed based on a medical history and a physical exam. To determine if you have mild or severe GERD, your health care provider may also monitor how you respond to treatment. You may also have tests, including: A test to examine your stomach and esophagus with a small camera (endoscopy). A test that measures the acidity level in your esophagus. A test that measures how much pressure is on your esophagus. A barium swallow or modified barium swallow test to show the shape, size, and functioning of your esophagus. How is this treated? Treatment for this condition may vary depending on how severe your symptoms are. Your health care provider may recommend: Changes to your diet. Medicine. Surgery. The goal of treatment is to help relieve your symptoms and to prevent complications. Follow these instructions at home: Eating and drinking  Follow a diet as recommended by your health care provider. This may involve avoiding foods and drinks such as: Coffee and tea, with or without caffeine. Drinks that contain alcohol. Energy drinks and sports drinks. Carbonated drinks or sodas. Chocolate and cocoa. Peppermint and mint flavorings. Garlic and onions. Horseradish. Spicy and acidic foods, including peppers, chili powder, curry powder, vinegar, hot sauces, and barbecue sauce. Citrus fruit juices and citrus fruits, such as oranges, lemons, and limes. Tomato-based foods, such as red sauce, chili, salsa, and pizza with red sauce. Fried and fatty foods, such as donuts, french fries, potato chips, and  high-fat dressings. High-fat meats, such as hot dogs and fatty cuts of red and white meats, such as rib eye steak, sausage, ham, and  bacon. High-fat dairy items, such as whole milk, butter, and cream cheese. Eat small, frequent meals instead of large meals. Avoid drinking large amounts of liquid with your meals. Avoid eating meals during the 2-3 hours before bedtime. Avoid lying down right after you eat. Do not exercise right after you eat. Lifestyle  Do not use any products that contain nicotine or tobacco. These products include cigarettes, chewing tobacco, and vaping devices, such as e-cigarettes. If you need help quitting, ask your health care provider. Try to reduce your stress by using methods such as yoga or meditation. If you need help reducing stress, ask your health care provider. If you are overweight, reduce your weight to an amount that is healthy for you. Ask your health care provider for guidance about a safe weight loss goal. General instructions Pay attention to any changes in your symptoms. Take over-the-counter and prescription medicines only as told by your health care provider. Do not take aspirin, ibuprofen, or other NSAIDs unless your health care provider told you to take these medicines. Wear loose-fitting clothing. Do not wear anything tight around your waist that causes pressure on your abdomen. Raise (elevate) the head of your bed about 6 inches (15 cm). You can use a wedge to do this. Avoid bending over if this makes your symptoms worse. Keep all follow-up visits. This is important. Contact a health care provider if: You have: New symptoms. Unexplained weight loss. Difficulty swallowing or it hurts to swallow. Wheezing or a persistent cough. A hoarse voice. Your symptoms do not improve with treatment. Get help right away if: You have sudden pain in your arms, neck, jaw, teeth, or back. You suddenly feel sweaty, dizzy, or light-headed. You have chest pain or shortness of breath. You vomit and the vomit is green, yellow, or black, or it looks like blood or coffee grounds. You faint. You  have stool that is red, bloody, or black. You cannot swallow, drink, or eat. These symptoms may represent a serious problem that is an emergency. Do not wait to see if the symptoms will go away. Get medical help right away. Call your local emergency services (911 in the [place] Do not drive yourself to the hospital. Summary Gastroesophageal reflux happens when acid from the stomach flows up into the esophagus. GERD is a disease in which the reflux happens often, causes frequent or severe symptoms, or causes problems such as damage to the esophagus. Treatment for this condition may vary depending on how severe your symptoms are. Your health care provider may recommend diet and lifestyle changes, medicine, or surgery. Contact a health care provider if you have new or worsening symptoms. Take over-the-counter and prescription medicines only as told by your health care provider. Do not take aspirin, ibuprofen, or other NSAIDs unless your health care provider told you to do so. Keep all follow-up visits as told by your health care provider. This is important. This information is not intended to replace advice given to you by your health care provider. Make sure you discuss any questions you have with your health care provider. Document Revised: 03/06/2020 Document Reviewed: 03/06/2020 Elsevier Patient Education  2022 [employer] for Gastroesophageal Reflux Disease, Adult When you have gastroesophageal reflux disease (GERD), the foods you eat and your eating habits are very important. Choosing the right foods can help ease your  discomfort. Think about working with a food expert (dietitian) to help you make good choices. What are tips for following this plan? Reading food labels Look for foods that are low in saturated fat. Foods that may help with your symptoms include: Foods that have less than 5% of daily value (DV) of fat. Foods that have 0 grams of trans fat. Cooking Do not fry your  food. Cook your food by baking, steaming, grilling, or broiling. These are all methods that do not need a lot of fat for cooking. To add flavor, try to use herbs that are low in spice and acidity. Meal planning  Choose healthy foods that are low in fat, such as: Fruits and vegetables. Whole grains. Low-fat dairy products. Lean meats, fish, and poultry. Eat small meals often instead of eating 3 large meals each day. Eat your meals slowly in a place where you are relaxed. Avoid bending over or lying down until 2-3 hours after eating. Limit high-fat foods such as fatty meats or fried foods. Limit your intake of fatty foods, such as oils, butter, and shortening. Avoid the following as told by your doctor: Foods that cause symptoms. These may be different for different people. Keep a food diary to keep track of foods that cause symptoms. Alcohol. Drinking a lot of liquid with meals. Eating meals during the 2-3 hours before bed. Lifestyle Stay at a healthy weight. Ask your doctor what weight is healthy for you. If you need to lose weight, work with your doctor to do so safely. Exercise for at least 30 minutes on 5 or more days each week, or as told by your doctor. Wear loose-fitting clothes. Do not smoke or use any products that contain nicotine or tobacco. If you need help quitting, ask your doctor. Sleep with the head of your bed higher than your feet. Use a wedge under the mattress or blocks under the bed frame to raise the head of the bed. Chew sugar-free gum after meals. What foods should eat? Eat a healthy, well-balanced diet of fruits, vegetables, whole grains, low-fat dairy products, lean meats, fish, and poultry. Each person is different. Foods that may cause symptoms in one person may not cause any symptoms in another person. Work with your doctor to find foods that are safe for you. The items listed above may not be a complete list of what you can eat and drink. Contact a food expert  for more options. What foods should I avoid? Limiting some of these foods may help in managing the symptoms of GERD. Everyone is different. Talk with a food expert or your doctor to help you find the exact foods to avoid, if any. Fruits Any fruits prepared with added fat. Any fruits that cause symptoms. For some people, this may include citrus fruits, such as oranges, grapefruit, pineapple, and lemons. Vegetables Deep-fried vegetables. [place] fries. Any vegetables prepared with added fat. Any vegetables that cause symptoms. For some people, this may include tomatoes and tomato products, chili peppers, onions and garlic, and horseradish. Grains Pastries or quick breads with added fat. Meats and other proteins High-fat meats, such as fatty beef or pork, hot dogs, ribs, ham, sausage, salami, and bacon. Fried meat or protein, including fried fish and fried chicken. Nuts and nut butters, in large amounts. Dairy Whole milk and chocolate milk. Sour cream. Cream. Ice cream. Cream cheese. Milkshakes. Fats and oils Butter. Margarine. Shortening. Ghee. Beverages Coffee and tea, with or without caffeine. Carbonated beverages. Sodas. Energy drinks. Fruit  juice made with acidic fruits, such as orange or grapefruit. Tomato juice. Alcoholic drinks. Sweets and desserts Chocolate and cocoa. Donuts. Seasonings and condiments Pepper. Peppermint and spearmint. Added salt. Any condiments, herbs, or seasonings that cause symptoms. For some people, this may include curry, hot sauce, or vinegar-based salad dressings. The items listed above may not be a complete list of what you should not eat and drink. Contact a food expert for more options. Questions to ask your doctor Diet and lifestyle changes are often the first steps that are taken to manage symptoms of GERD. If diet and lifestyle changes do not help, talk with your doctor about taking medicines. Where to find more information [employer] for  Gastrointestinal Disorders: aboutgerd.org Summary When you have GERD, food and lifestyle choices are very important in easing your symptoms. Eat small meals often instead of 3 large meals a day. Eat your meals slowly and in a place where you are relaxed. Avoid bending over or lying down until 2-3 hours after eating. Limit high-fat foods such as fatty meats or fried foods. This information is not intended to replace advice given to you by your health care provider. Make sure you discuss any questions you have with your health care provider. Document Revised: 03/06/2020 Document Reviewed: 03/06/2020 Elsevier Patient Education  [place]. Sucralfate Tablets What is this medication? SUCRALFATE (SOO kral fate) treats stomach ulcers. It works by helping to form a barrier over the ulcer, which protects it from stomach acid and allows it to heal. This medicine may be used for other purposes; ask your health care provider or pharmacist if you have questions. COMMON BRAND NAME(S): Carafate What should I tell my care team before I take this medication? They need to know if you have any of these conditions: Kidney disease An unusual or allergic reaction to sucralfate, other medications, foods, dyes, or preservatives Pregnant or trying to get pregnant Breast-feeding How should I use this medication? Take this medication by mouth with a glass of water. Follow the directions on the prescription label. This medication works best if you take it on an empty stomach, 1 hour before meals. Take your doses at regular intervals. Do not take your medication more often than directed. Do not stop taking except on your care team's advice. Talk to your care team about the use of this medication in children. Special care may be needed. Overdosage: If you think you have taken too much of this medicine contact a poison control center or emergency room at once. NOTE: This medicine is only for you. Do not share this  medicine with others. What if I miss a dose? If you miss a dose, take it as soon as you can. If it is almost time for your next dose, take only that dose. Do not take double or extra doses. What may interact with this medication? Antacid Cimetidine Digoxin Ketoconazole Phenytoin Quinidine Ranitidine Some antibiotics like ciprofloxacin, norfloxacin, and ofloxacin Theophylline Thyroid hormones Warfarin This list may not describe all possible interactions. Give your health care provider a list of all the medicines, herbs, non-prescription drugs, or dietary supplements you use. Also tell them if you smoke, drink alcohol, or use illegal drugs. Some items may interact with your medicine. What should I watch for while using this medication? Visit your care team for regular check-ups. Let your care team know if your symptoms do not improve or if you feel worse. Antacids should not be taken within one half hour before or  after this medication. What side effects may I notice from receiving this medication? Side effects that you should report to your care team as soon as possible: Allergic reactions--skin rash, itching, hives, swelling of the face, lips, tongue, or throat Side effects that usually do not require medical attention (report to your care team if they continue or are bothersome): Constipation This list may not describe all possible side effects. Call your doctor for medical advice about side effects. You may report side effects to FDA at 1-800-FDA-1088. Where should I keep my medication? Keep out of the reach of children. Store at room temperature between 15 and 30 degrees C (59 and 86 degrees F). Keep container tightly closed. Throw away any unused medication after the expiration date. NOTE: This sheet is a summary. It may not cover all possible information. If you have questions about this medicine, talk to your doctor, pharmacist, or health care provider.  2022 Elsevier/Gold  Standard (2020-12-31 00:00:00) Omeprazole Tablets What is this medication? OMEPRAZOLE (oh ME pray zol) is used to treat heartburn, stomach ulcers, reflux disease, or other conditions that cause too much stomach acid. It works by reducing the amount of acid in the stomach. It belongs to a group of medications called PPIs. This medicine may be used for other purposes; ask your health care provider or pharmacist if you have questions. COMMON BRAND NAME(S): Prilosec OTC What should I tell my care team before I take this medication? They need to know if you have any of these conditions: Chest pain Have had heartburn for over 3 months Have heartburn with dizziness, lightheadedness or sweating Liver disease Low levels of calcium, magnesium, or potassium in the blood Lupus Stomach pain Trouble swallowing Unexplained weight loss Vomiting with blood Wheezing An unusual or allergic reaction to omeprazole, other medications, foods, dyes, or preservatives Pregnant or trying to get pregnant Breast-feeding How should I use this medication? Take this medication by mouth with a glass of water. Follow the directions on the product label. Do not cut, crush or chew this medication. Swallow the tablets whole. Take this medication on an empty stomach, at least 30 minutes before breakfast. Take your medication at regular intervals. Do not take it more often than directed. Talk to your care team about the use of this medication in children. Special care may be needed. Overdosage: If you think you have taken too much of this medicine contact a poison control center or emergency room at once. NOTE: This medicine is only for you. Do not share this medicine with others. What if I miss a dose? If you miss a dose, take it as soon as you can. If it is almost time for your next dose, take only that dose. Do not take double or extra doses. What may interact with this medication? Do not take this medication with any of  the following: Atazanavir Clopidogrel Nelfinavir Rilpivirine This medication may also interact with the following: Antifungals like itraconazole, ketoconazole, and voriconazole Certain antivirals for HIV or hepatitis Certain medications that treat or prevent blood clots like warfarin Cilostazol Citalopram Cyclosporine Dasatinib Digoxin Disulfiram Diuretics Erlotinib Iron supplements Medications for anxiety, panic, and sleep like diazepam Medications for seizures like carbamazepine, phenobarbital, phenytoin Methotrexate Mycophenolate mofetil Nilotinib Rifampin St. John's wort Tacrolimus Vitamin B12 This list may not describe all possible interactions. Give your health care provider a list of all the medicines, herbs, non-prescription drugs, or dietary supplements you use. Also tell them if you smoke, drink alcohol, or use illegal  drugs. Some items may interact with your medicine. What should I watch for while using this medication? It can take several days before your stomach pain gets better. Check with your care team if your condition does not start to get better, or if it gets worse. Do not treat diarrhea with over the counter products. Contact your care team if you have diarrhea that lasts more than 2 days or if it is severe and watery. You may need blood work done while you are taking this medication. Using this medication for a long time may weaken your bones. The risk of bone fractures may be increased. Talk to your care team about your bone health. Using this medication for a long time may cause growths (polyps) in the stomach. They usually don't cause any symptoms. They are usually not cancerous. Contact your care team if you notice pain or tenderness when you press your stomach, have nausea, or see bloody or black, tar-like stools. This medication may cause a decrease in vitamin B12. You should make sure that you get enough vitamin B12 while you are taking this medication.  Discuss the foods you eat and the vitamins you take with your care team. What side effects may I notice from receiving this medication? Side effects that you should report to your care team as soon as possible: Allergic reactions--skin rash, itching, hives, swelling of the face, lips, tongue, or throat Kidney injury--decrease in the amount of urine, swelling of the ankles, hands, or feet Low magnesium level--muscle pain or cramps, unusual weakness, fatigue, fast or irregular heartbeat, tremors Low vitamin B12 level--pain, tingling, or numbness in the hands or feet, muscle weakness, dizziness, confusion, difficulty concentrating Rash on the cheeks or arms that gets worse in the sun Redness, blistering, peeling, or loosening of the skin, including inside the mouth Severe diarrhea, fever Unusual bleeding or bruising Side effects that usually do not require medical attention (report to your care team if they continue or are bothersome): Gas Headache Nausea Stomach pain Vomiting This list may not describe all possible side effects. Call your doctor for medical advice about side effects. You may report side effects to FDA at 1-800-FDA-1088. Where should I keep my medication? Keep out of the reach of children and pets. Store at room temperature between 20 and 25 degrees C (68 and 77 degrees F). Protect from light and moisture. Get rid of any unused medication after the expiration date. To get rid of medications that are no longer needed or expired: Take the medication to a medication take-back program. Check with your pharmacy or law enforcement to find a location. If you cannot return the medication, check the label or package insert to see if the medication should be thrown out in the garbage or flushed down the toilet. If you are not sure, ask your care team. If it is safe to put in the trash, empty the medication out of the container. Mix the medication with cat litter, dirt, coffee grounds, or  other unwanted substance. Seal the mixture in a bag or container. Put it in the trash. NOTE: This sheet is a summary. It may not cover all possible information. If you have questions about this medicine, talk to your doctor, pharmacist, or health care provider.  2022 Elsevier/Gold Standard (2020-11-21 00:00:00)

## 2021-10-05 ENCOUNTER — Telehealth: Payer: Self-pay

## 2021-10-05 NOTE — Telephone Encounter (Signed)
Scheduled for 12/18/2021

## 2021-10-08 ENCOUNTER — Other Ambulatory Visit
Admission: RE | Admit: 2021-10-08 | Discharge: 2021-10-08 | Disposition: A | Discharge status: Home / Self Care | Payer: Self-pay | Source: Ambulatory Visit | Attending: Adult Health | Admitting: Adult Health

## 2021-10-08 ENCOUNTER — Telehealth: Payer: Self-pay

## 2021-10-08 DIAGNOSIS — K219 Gastro-esophageal reflux disease without esophagitis: Secondary | ICD-10-CM | POA: Insufficient documentation

## 2021-10-08 DIAGNOSIS — R197 Diarrhea, unspecified: Secondary | ICD-10-CM | POA: Insufficient documentation

## 2021-10-08 LAB — GASTROINTESTINAL PANEL BY PCR, STOOL (REPLACES STOOL CULTURE)

## 2021-10-08 LAB — OCCULT BLOOD X 1 CARD TO LAB, STOOL: Fecal Occult Bld: NEGATIVE

## 2021-10-08 NOTE — Progress Notes (Signed)
Fecal blood negative.

## 2021-10-08 NOTE — Progress Notes (Signed)
Negative stool culture

## 2021-10-08 NOTE — Telephone Encounter (Signed)
Called pt to inform about fecal blood test unable to reach [facility].

## 2021-10-11 ENCOUNTER — Other Ambulatory Visit: Payer: Self-pay

## 2021-10-11 ENCOUNTER — Encounter: Payer: Self-pay | Admitting: Adult Health

## 2021-10-11 ENCOUNTER — Ambulatory Visit (INDEPENDENT_AMBULATORY_CARE_PROVIDER_SITE_OTHER): Payer: 59 | Admitting: Adult Health

## 2021-10-11 VITALS — BP 130/88 | HR 88 | Temp 97.5°F | Resp 14 | Ht 70.0 in | Wt 211.0 lb

## 2021-10-11 DIAGNOSIS — J309 Allergic rhinitis, unspecified: Secondary | ICD-10-CM

## 2021-10-11 DIAGNOSIS — K219 Gastro-esophageal reflux disease without esophagitis: Secondary | ICD-10-CM

## 2021-10-11 DIAGNOSIS — D721 Eosinophilia, unspecified: Secondary | ICD-10-CM

## 2021-10-11 NOTE — Progress Notes (Signed)
Acute Office Visit  Subjective:    Patient ID: Mark Parsons., male    DOB: 1966/09/03, 56 y.o.   MRN: [id number]  Chief Complaint  Patient presents with   Follow-up    1 week f/u. Still having stomach issues and diarrhea not as much as before. Review labs.     HPI Patient is in today for follow up to GERD, suspected ulcer, he had fecal blood lab negative.  He was seen on 10/04/21 for severe gerd symptoms started on omeprazole 40 mg once a day orally with Carafate as well.  He was on Tagement when he came in without relief - provider discontinued that. He was referred to gastroenterology to discuss endoscopy.   He reports he is still mildly sore in epigastrium area. No dark or tarry stools denies any bleeding.   Labs Amylase low, CBC showed eosinophilia otherwise ok.   Follow up with Leone Haven, MD is 12/03/21  Seeing gastroenterology Dr. Vicente Males on 12/18/2021  Patient  denies any fever, body aches,chills, rash, chest pain, shortness of breath, nausea, vomiting, or diarrhea. Denies dizziness, lightheadedness, pre syncopal or syncopal episodes.  Diarrhea resolved no longer hurts with swallowing food.   Past Medical History:  Diagnosis Date   Depression    Hypertension    Low testosterone in male     No past surgical history on file.  Family History  Problem Relation Age of Onset   Colon cancer Mother    Multiple myeloma Father    Aortic aneurysm Father    Stroke Father     Social History   Socioeconomic History   Marital status: Single    Spouse name: Not on file   Number of children: Not on file   Years of education: Not on file   Highest education level: Not on file  Occupational History   Not on file  Tobacco Use   Smoking status: Never   Smokeless tobacco: Never  Vaping Use   Vaping Use: Never used  Substance and Sexual Activity   Alcohol use: Yes    Alcohol/week: 40.0 standard drinks    Types: 40 Cans of beer per week   Drug use: No   Sexual  activity: Not Currently  Other Topics Concern   Not on file  Social History Narrative   Not on file   Social Determinants of Health   Financial Resource Strain: Not on file  Food Insecurity: Not on file  Transportation Needs: Not on file  Physical Activity: Not on file  Stress: Not on file  Social Connections: Not on file  Intimate Partner Violence: Not on file    Outpatient Medications Prior to Visit  Medication Sig Dispense Refill   FLUoxetine (PROZAC) 40 MG capsule Take 1 capsule by mouth once daily 90 capsule 1   lisinopril-hydrochlorothiazide (ZESTORETIC) 20-25 MG tablet Take 1 tablet by mouth once daily 90 tablet 1   omeprazole (PRILOSEC) 40 MG capsule Take 1 capsule (40 mg total) by mouth daily. 30 capsule 3   tamsulosin (FLOMAX) 0.4 MG CAPS capsule Take 2 capsules (0.8 mg total) by mouth daily. 180 capsule 3   Testosterone (ANDROGEL) 20.25 MG/1.25GM (1.62%) GEL Apply 3 Pump topically daily. 300 g 1   Testosterone 1.62 % GEL SMARTSIG:3 Pump Topical Daily     rosuvastatin (CRESTOR) 20 MG tablet Take 1 tablet (20 mg total) by mouth daily. (Patient not taking: Reported on 10/11/2021) 90 tablet 3   sucralfate (CARAFATE) 1 g tablet  Take 1 tablet (1 g total) by mouth 4 (four) times daily -  with meals and at bedtime for 12 days. 48 tablet 0   tadalafil (CIALIS) 20 MG tablet Take 1 tablet (20 mg total) by mouth daily as needed for erectile dysfunction. (Patient not taking: Reported on 10/11/2021) 30 tablet 3   No facility-administered medications prior to visit.    Allergies  Allergen Reactions   Propofol Anaphylaxis    Near death   Recent Results (from the past 2160 hour(s))  Testosterone     Status: None   Collection Time: 08/30/21  1:18 PM  Result Value Ref Range   Testosterone 406 264 - 916 ng/dL    Comment: Adult male reference interval is based on a population of healthy nonobese males (BMI <30) between 81 and 70 years old. [place], [place] [id number]. PMID:  [id number].   CBC with Differential/Platelet     Status: Abnormal   Collection Time: 10/04/21 12:06 PM  Result Value Ref Range   WBC 6.8 4.0 - 10.5 K/uL   RBC 5.13 4.22 - 5.81 Mil/uL   Hemoglobin 16.0 13.0 - 17.0 g/dL   HCT 48.3 39.0 - 52.0 %   MCV 94.2 78.0 - 100.0 fl   MCHC 33.1 30.0 - 36.0 g/dL   RDW 13.9 11.5 - 15.5 %   Platelets 232.0 150.0 - 400.0 K/uL   Neutrophils Relative % 60.6 43.0 - 77.0 %   Lymphocytes Relative 19.9 12.0 - 46.0 %   Monocytes Relative 9.6 3.0 - 12.0 %   Eosinophils Relative 9.3 (H) 0.0 - 5.0 %   Basophils Relative 0.6 0.0 - 3.0 %   Neutro Abs 4.1 1.4 - 7.7 K/uL   Lymphs Abs 1.4 0.7 - 4.0 K/uL   Monocytes Absolute 0.7 0.1 - 1.0 K/uL   Eosinophils Absolute 0.6 0.0 - 0.7 K/uL   Basophils Absolute 0.0 0.0 - 0.1 K/uL  Comprehensive metabolic panel     Status: Abnormal   Collection Time: 10/04/21 12:06 PM  Result Value Ref Range   Sodium 136 135 - 145 mEq/L   Potassium 3.5 3.5 - 5.1 mEq/L   Chloride 100 96 - 112 mEq/L   CO2 28 19 - 32 mEq/L   Glucose, Bld 107 (H) 70 - 99 mg/dL   BUN 14 6 - 23 mg/dL   Creatinine, Ser 0.89 0.40 - 1.50 mg/dL   Total Bilirubin 0.5 0.2 - 1.2 mg/dL   Alkaline Phosphatase 73 39 - 117 U/L   AST 18 0 - 37 U/L   ALT 26 0 - 53 U/L   Total Protein 7.3 6.0 - 8.3 g/dL   Albumin 4.1 3.5 - 5.2 g/dL   GFR 97.17 >60.00 mL/min    Comment: Calculated using the CKD-EPI Creatinine Equation (2021)   Calcium 9.2 8.4 - 10.5 mg/dL  Amylase     Status: Abnormal   Collection Time: 10/04/21 12:06 PM  Result Value Ref Range   Amylase 25 (L) 27 - 131 U/L  Lipase     Status: None   Collection Time: 10/04/21 12:06 PM  Result Value Ref Range   Lipase 24.0 11.0 - 59.0 U/L  Gastrointestinal Panel by PCR , Stool     Status: None   Collection Time: 10/08/21 11:30 AM   Specimen: Stool  Result Value Ref Range   Campylobacter species NOT DETECTED NOT DETECTED   Plesimonas shigelloides NOT DETECTED NOT DETECTED   Salmonella species NOT DETECTED NOT  DETECTED   Yersinia enterocolitica  NOT DETECTED NOT DETECTED   Vibrio species NOT DETECTED NOT DETECTED   Vibrio cholerae NOT DETECTED NOT DETECTED   Enteroaggregative E coli (EAEC) NOT DETECTED NOT DETECTED   Enteropathogenic E coli (EPEC) NOT DETECTED NOT DETECTED   Enterotoxigenic E coli (ETEC) NOT DETECTED NOT DETECTED   Shiga like toxin producing E coli (STEC) NOT DETECTED NOT DETECTED   Shigella/Enteroinvasive E coli (EIEC) NOT DETECTED NOT DETECTED   Cryptosporidium NOT DETECTED NOT DETECTED   Cyclospora cayetanensis NOT DETECTED NOT DETECTED   Entamoeba histolytica NOT DETECTED NOT DETECTED   Giardia lamblia NOT DETECTED NOT DETECTED   Adenovirus F40/41 NOT DETECTED NOT DETECTED   Astrovirus NOT DETECTED NOT DETECTED   Norovirus GI/GII NOT DETECTED NOT DETECTED   Rotavirus A NOT DETECTED NOT DETECTED   Sapovirus (I, II, IV, and V) NOT DETECTED NOT DETECTED    Comment: Performed at [facility], [address] [place], [place] [address]  Occult blood card to lab, stool Provider will collect     Status: None   Collection Time: 10/08/21 11:30 AM  Result Value Ref Range   Fecal Occult Bld NEGATIVE NEGATIVE    Comment: Performed at [facility], [address], [place], [place] [address]     Review of Systems  HENT: Negative.    Respiratory: Negative.    Cardiovascular: Negative.   Gastrointestinal:  Positive for abdominal pain (epigatric- feels much improved no nausea or vomiting now.). Negative for abdominal distention, anal bleeding, blood in stool, constipation, diarrhea, nausea, rectal pain and vomiting.  Genitourinary: Negative.   Musculoskeletal: Negative.   Skin: Negative.   Neurological: Negative.   Hematological: Negative.   Psychiatric/Behavioral: Negative.        Objective:    Physical Exam   General: Appearance:    Obese male in no acute distress  Eyes:    PERRL, conjunctiva/corneas clear, EOM's intact       Lungs:     Clear to  auscultation bilaterally, respirations unlabored  Heart:    Normal heart rate. Normal rhythm. No murmurs, rubs, or gallops.    MS:   All extremities are intact.    Neurologic:   Awake, alert, oriented x 3. No apparent focal neurological           defect.    Abdomen normal to inspection. Bowel sounds normoactive throughout all quadrants. No gross deformity. No rash. No ecchymosis. Normoactive bowel sounds active all four quadrants. No pulsatile aorta or mass visualized. No diffuse tenderness with palpation. No guarding or facial grimace.  No rigidity. No rebound tenderness. No palpable mass. No organomegaly. No CVA tenderness with percussion.   BP 130/88 (BP Location: Left Arm, Patient Position: Sitting, Cuff Size: Small)    Pulse 88    Temp (!) 97.5 F (36.4 C) (Oral)    Resp 14    Ht '5\' 10"'  (1.778 m)    Wt 211 lb (95.7 kg)    SpO2 98%    BMI 30.28 kg/m  Wt Readings from Last 3 Encounters:  10/11/21 211 lb (95.7 kg)  10/04/21 212 lb 3.2 oz (96.3 kg)  07/10/21 213 lb (96.6 kg)    Health Maintenance Due  Topic Date Due   HIV Screening  Never done   Hepatitis C Screening  Never done   Zoster Vaccines- Shingrix (1 of 2) Never done   COVID-19 Vaccine (3 - Booster for Pfizer series) 03/14/2020   COLONOSCOPY (Pts 45-61yr Insurance coverage will need to be confirmed)  08/09/2021    There are no preventive care reminders to display for this patient.   No results found for: TSH Lab Results  Component Value Date   WBC 6.8 10/04/2021   HGB 16.0 10/04/2021   HCT 48.3 10/04/2021   MCV 94.2 10/04/2021   PLT 232.0 10/04/2021   Lab Results  Component Value Date   NA 136 10/04/2021   K 3.5 10/04/2021   CO2 28 10/04/2021   GLUCOSE 107 (H) 10/04/2021   BUN 14 10/04/2021   CREATININE 0.89 10/04/2021   BILITOT 0.5 10/04/2021   ALKPHOS 73 10/04/2021   AST 18 10/04/2021   ALT 26 10/04/2021   PROT 7.3 10/04/2021   ALBUMIN 4.1 10/04/2021   CALCIUM 9.2 10/04/2021   ANIONGAP 5 (L)  09/24/2013   GFR 97.17 10/04/2021   Lab Results  Component Value Date   CHOL 246 (H) 06/04/2021   Lab Results  Component Value Date   HDL 46.20 06/04/2021   Lab Results  Component Value Date   [facility]  05/05/2020     Comment:     . LDL cholesterol not calculated. Triglyceride levels greater than 400 mg/dL invalidate calculated LDL results. . Reference range: <100 . Desirable range <100 mg/dL for primary prevention;   <70 mg/dL for patients with CHD or diabetic patients  with > or = 2 CHD risk factors. Marland Kitchen LDL-C is now calculated using the Martin-Hopkins  calculation, which is a validated novel method providing  better accuracy than the Friedewald equation in the  estimation of LDL-C.  Cresenciano Genre et al. Annamaria Helling. 8333;832(91): 2061-2068  (http://education.[employer].com/faq/FAQ164)    Lab Results  Component Value Date   TRIG (H) 06/04/2021    438.0 Triglyceride is over 400; calculations on Lipids are invalid.   Lab Results  Component Value Date   CHOLHDL 5 06/04/2021   Lab Results  Component Value Date   HGBA1C 5.7 06/04/2021       Assessment & Plan:   Problem List Items Addressed This Visit       Respiratory   Allergic rhinitis     Digestive   Gastroesophageal reflux disease - Primary     Other   Eosinophilia   Relevant Orders   CBC with Differential/Platelet   Amylase   Continue PPI and Carafate as prescribed.   Start Claritin daily for elevated eosinophilia and nasal allergies year round. Recheck CBC in 2 weeks.   No orders of the defined types were placed in this encounter.  Red Flags discussed. The patient was given clear instructions to go to ER or return to medical center if any red flags develop, symptoms do not improve, worsen or new problems develop. They verbalized understanding.   Return in about 3 weeks (around 11/01/2021), or if symptoms worsen or fail to improve, for at any time for any worsening symptoms, Go to Emergency room/  urgent care if worse.    Marcille Buffy, FNP

## 2021-10-11 NOTE — Patient Instructions (Addendum)
Recheck labs schedule.  Finish Carafate, and continue Prilosec until seeing gastroenterology.   Claritin 10 mg everyday for allergies is ok  over the counter daily.    Gastroesophageal Reflux Disease, Adult Gastroesophageal reflux (GER) happens when acid from the stomach flows up into the tube that connects the mouth and the stomach (esophagus). Normally, food travels down the esophagus and stays in the stomach to be digested. With GER, food and stomach acid sometimes move back up into the esophagus. You may have a disease called gastroesophageal reflux disease (GERD) if the reflux: Happens often. Causes frequent or very bad symptoms. Causes problems such as damage to the esophagus. When this happens, the esophagus becomes sore and swollen. Over time, GERD can make small holes (ulcers) in the lining of the esophagus. What are the causes? This condition is caused by a problem with the muscle between the esophagus and the stomach. When this muscle is weak or not normal, it does not close properly to keep food and acid from coming back up from the stomach. The muscle can be weak because of: Tobacco use. Pregnancy. Having a certain type of hernia (hiatal hernia). Alcohol use. Certain foods and drinks, such as coffee, chocolate, onions, and peppermint. What increases the risk? Being overweight. Having a disease that affects your connective tissue. Taking NSAIDs, such a ibuprofen. What are the signs or symptoms? Heartburn. Difficult or painful swallowing. The feeling of having a lump in the throat. A bitter taste in the mouth. Bad breath. Having a lot of saliva. Having an upset or bloated stomach. Burping. Chest pain. Different conditions can cause chest pain. Make sure you see your doctor if you have chest pain. Shortness of breath or wheezing. A long-term cough or a cough at night. Wearing away of the surface of teeth (tooth enamel). Weight loss. How is this treated? Making  changes to your diet. Taking medicine. Having surgery. Treatment will depend on how bad your symptoms are. Follow these instructions at home: Eating and drinking  Follow a diet as told by your doctor. You may need to avoid foods and drinks such as: Coffee and tea, with or without caffeine. Drinks that contain alcohol. Energy drinks and sports drinks. Bubbly (carbonated) drinks or sodas. Chocolate and cocoa. Peppermint and mint flavorings. Garlic and onions. Horseradish. Spicy and acidic foods. These include peppers, chili powder, curry powder, vinegar, hot sauces, and BBQ sauce. Citrus fruit juices and citrus fruits, such as oranges, lemons, and limes. Tomato-based foods. These include red sauce, chili, salsa, and pizza with red sauce. Fried and fatty foods. These include donuts, french fries, potato chips, and high-fat dressings. High-fat meats. These include hot dogs, rib eye steak, sausage, ham, and bacon. High-fat dairy items, such as whole milk, butter, and cream cheese. Eat small meals often. Avoid eating large meals. Avoid drinking large amounts of liquid with your meals. Avoid eating meals during the 2-3 hours before bedtime. Avoid lying down right after you eat. Do not exercise right after you eat. Lifestyle  Do not smoke or use any products that contain nicotine or tobacco. If you need help quitting, ask your doctor. Try to lower your stress. If you need help doing this, ask your doctor. If you are overweight, lose an amount of weight that is healthy for you. Ask your doctor about a safe weight loss goal. General instructions Pay attention to any changes in your symptoms. Take over-the-counter and prescription medicines only as told by your doctor. Do not take aspirin, ibuprofen,  or other NSAIDs unless your doctor says it is okay. Wear loose clothes. Do not wear anything tight around your waist. Raise (elevate) the head of your bed about 6 inches (15 cm). You may need  to use a wedge to do this. Avoid bending over if this makes your symptoms worse. Keep all follow-up visits. Contact a doctor if: You have new symptoms. You lose weight and you do not know why. You have trouble swallowing or it hurts to swallow. You have wheezing or a cough that keeps happening. You have a hoarse voice. Your symptoms do not get better with treatment. Get help right away if: You have sudden pain in your arms, neck, jaw, teeth, or back. You suddenly feel sweaty, dizzy, or light-headed. You have chest pain or shortness of breath. You vomit and the vomit is green, yellow, or black, or it looks like blood or coffee grounds. You faint. Your poop (stool) is red, bloody, or black. You cannot swallow, drink, or eat. These symptoms may represent a serious problem that is an emergency. Do not wait to see if the symptoms will go away. Get medical help right away. Call your local emergency services (911 in the [place] Do not drive yourself to the hospital. Summary If a person has gastroesophageal reflux disease (GERD), food and stomach acid move back up into the esophagus and cause symptoms or problems such as damage to the esophagus. Treatment will depend on how bad your symptoms are. Follow a diet as told by your doctor. Take all medicines only as told by your doctor. This information is not intended to replace advice given to you by your health care provider. Make sure you discuss any questions you have with your health care provider. Document Revised: 03/06/2020 Document Reviewed: 03/06/2020 Elsevier Patient Education  [place].

## 2021-10-11 NOTE — Progress Notes (Signed)
Pt had labs reviewed in OV today 2/2 with Laverna Peace FNP

## 2021-10-14 ENCOUNTER — Encounter: Payer: Self-pay | Admitting: Adult Health

## 2021-10-14 DIAGNOSIS — D721 Eosinophilia, unspecified: Secondary | ICD-10-CM | POA: Insufficient documentation

## 2021-10-14 DIAGNOSIS — J309 Allergic rhinitis, unspecified: Secondary | ICD-10-CM | POA: Insufficient documentation

## 2021-10-22 ENCOUNTER — Other Ambulatory Visit: Payer: Self-pay | Admitting: Urology

## 2021-10-22 DIAGNOSIS — N5201 Erectile dysfunction due to arterial insufficiency: Secondary | ICD-10-CM

## 2021-11-09 ENCOUNTER — Encounter: Payer: Self-pay | Admitting: Oncology

## 2021-11-20 ENCOUNTER — Other Ambulatory Visit: Payer: Self-pay

## 2021-11-20 ENCOUNTER — Other Ambulatory Visit (INDEPENDENT_AMBULATORY_CARE_PROVIDER_SITE_OTHER): Payer: 59

## 2021-11-20 DIAGNOSIS — D721 Eosinophilia, unspecified: Secondary | ICD-10-CM | POA: Diagnosis not present

## 2021-11-20 DIAGNOSIS — E785 Hyperlipidemia, unspecified: Secondary | ICD-10-CM | POA: Diagnosis not present

## 2021-11-20 LAB — CBC WITH DIFFERENTIAL/PLATELET
Basophils Absolute: 0.1 10*3/uL (ref 0.0–0.1)
Basophils Relative: 0.8 % (ref 0.0–3.0)
Eosinophils Absolute: 0.1 10*3/uL (ref 0.0–0.7)
Eosinophils Relative: 1 % (ref 0.0–5.0)
HCT: 44.3 % (ref 39.0–52.0)
Hemoglobin: 14.8 g/dL (ref 13.0–17.0)
Lymphocytes Relative: 22.8 % (ref 12.0–46.0)
Lymphs Abs: 1.7 10*3/uL (ref 0.7–4.0)
MCHC: 33.4 g/dL (ref 30.0–36.0)
MCV: 95.1 fl (ref 78.0–100.0)
Monocytes Absolute: 0.7 10*3/uL (ref 0.1–1.0)
Monocytes Relative: 8.9 % (ref 3.0–12.0)
Neutro Abs: 5 10*3/uL (ref 1.4–7.7)
Neutrophils Relative %: 66.5 % (ref 43.0–77.0)
Platelets: 269 10*3/uL (ref 150.0–400.0)
RBC: 4.66 Mil/uL (ref 4.22–5.81)
RDW: 13.9 % (ref 11.5–15.5)
WBC: 7.4 10*3/uL (ref 4.0–10.5)

## 2021-11-20 LAB — HEPATIC FUNCTION PANEL
ALT: 23 U/L (ref 0–53)
AST: 17 U/L (ref 0–37)
Albumin: 4.1 g/dL (ref 3.5–5.2)
Alkaline Phosphatase: 59 U/L (ref 39–117)
Bilirubin, Direct: 0.1 mg/dL (ref 0.0–0.3)
Total Bilirubin: 0.5 mg/dL (ref 0.2–1.2)
Total Protein: 6.8 g/dL (ref 6.0–8.3)

## 2021-11-20 LAB — LIPID PANEL
Cholesterol: 212 mg/dL — ABNORMAL HIGH (ref 0–200)
HDL: 39.2 mg/dL (ref >39.00)
NonHDL: 172.92
Total CHOL/HDL Ratio: 5
Triglycerides: 350 mg/dL — ABNORMAL HIGH (ref 0.0–149.0)
VLDL: 70 mg/dL — ABNORMAL HIGH (ref 0.0–40.0)

## 2021-11-20 LAB — AMYLASE: Amylase: 26 U/L — ABNORMAL LOW (ref 27–131)

## 2021-11-20 LAB — LDL CHOLESTEROL, DIRECT: Direct LDL: 145 mg/dL

## 2021-11-21 NOTE — Progress Notes (Signed)
Eosinophils within normal on this CBC.  ?Amylase slightly low, ok, keep GI appointment scheduled 12/24/21. ?Keep follow up with Leone Haven, MD PCP in March as scheduled.  ?LDL borderline high. Continue Crestor, diet and lifestyle changes  and follow up with PCP.

## 2021-11-23 ENCOUNTER — Ambulatory Visit: Payer: Self-pay | Admitting: Family Medicine

## 2021-12-03 ENCOUNTER — Other Ambulatory Visit: Payer: Self-pay

## 2021-12-03 ENCOUNTER — Ambulatory Visit (INDEPENDENT_AMBULATORY_CARE_PROVIDER_SITE_OTHER): Payer: 59 | Admitting: Family Medicine

## 2021-12-03 ENCOUNTER — Encounter: Payer: Self-pay | Admitting: Oncology

## 2021-12-03 ENCOUNTER — Encounter: Payer: Self-pay | Admitting: Family Medicine

## 2021-12-03 VITALS — BP 120/80 | HR 91 | Temp 98.1°F | Ht 70.0 in | Wt 209.0 lb

## 2021-12-03 DIAGNOSIS — I10 Essential (primary) hypertension: Secondary | ICD-10-CM

## 2021-12-03 DIAGNOSIS — K219 Gastro-esophageal reflux disease without esophagitis: Secondary | ICD-10-CM | POA: Diagnosis not present

## 2021-12-03 DIAGNOSIS — R69 Illness, unspecified: Secondary | ICD-10-CM | POA: Diagnosis not present

## 2021-12-03 DIAGNOSIS — R869 Unspecified abnormal finding in specimens from male genital organs: Secondary | ICD-10-CM

## 2021-12-03 DIAGNOSIS — E785 Hyperlipidemia, unspecified: Secondary | ICD-10-CM

## 2021-12-03 DIAGNOSIS — F1010 Alcohol abuse, uncomplicated: Secondary | ICD-10-CM

## 2021-12-03 DIAGNOSIS — F339 Major depressive disorder, recurrent, unspecified: Secondary | ICD-10-CM

## 2021-12-03 MED ORDER — ROSUVASTATIN CALCIUM 20 MG PO TABS: 20.0000 mg | ORAL_TABLET | Freq: Every day | ORAL | 3 refills | Status: DC

## 2021-12-03 MED ORDER — OMEPRAZOLE 40 MG PO CPDR: 40.0000 mg | DELAYED_RELEASE_CAPSULE | Freq: Every day | ORAL | 3 refills | Status: DC

## 2021-12-03 NOTE — Assessment & Plan Note (Signed)
Chronic issue.  Adequately controlled.  He will continue Prozac 40 mg once daily. ?

## 2021-12-03 NOTE — Assessment & Plan Note (Signed)
Generally stable and generally controlled.  He will continue lisinopril-HCTZ 1 tablet daily. ?

## 2021-12-03 NOTE — Patient Instructions (Signed)
Nice to see you. ?Please continue on the omeprazole once daily. ?Please start the Crestor. ?You will have lab work in 6 weeks. ?Somebody from [facility] should contact you to schedule an appointment. ?Please discuss the watery semen with your urologist.   ?

## 2021-12-03 NOTE — Assessment & Plan Note (Signed)
Discussed reduction of alcohol intake.  Discussed this could be contributing to depression, reflux issues, cholesterol issues, and blood pressure issues.  Discussed gradually reducing his alcohol intake given the risk of alcohol withdrawal causing death and seizures. ?

## 2021-12-03 NOTE — Assessment & Plan Note (Signed)
Discussed the need for him to see GI to consider an endoscopy.  Advised that we need to rule out a lesion contributing to his dysphagia.  Discussed the lesion could be benign or cancerous.  He will continue omeprazole 40 mg once daily.  Refer to GI at [facility] in [place]. ?

## 2021-12-03 NOTE — Assessment & Plan Note (Signed)
Patient reports watery semen.  I encouraged him to see his urologist regarding this. ?

## 2021-12-03 NOTE — Assessment & Plan Note (Signed)
Discussed the need to start on Crestor to reduce his risk of stroke and heart attack in the future.  He will start Crestor 20 mg once daily.  He will have lab work in 6 weeks. ?

## 2021-12-03 NOTE — Progress Notes (Signed)
hep ?[name], MD ?Phone: [phone] ? ?Mark Parsons. is a 56 y.o. male who presents today for f/u. ? ?HYPERTENSION ?Disease Monitoring ?Home BP Monitoring 130/80-90 Chest pain- no    Dyspnea- no ?Medications ?Compliance-  taking lisinopril/HCTZ.   Edema- no ?BMET ?   ?Component Value Date/Time  ? NA 136 10/04/2021 1206  ? NA 134 (L) 09/24/2013 1419  ? K 3.5 10/04/2021 1206  ? K 3.8 09/24/2013 1419  ? CL 100 10/04/2021 1206  ? CL 102 09/24/2013 1419  ? CO2 28 10/04/2021 1206  ? CO2 27 09/24/2013 1419  ? GLUCOSE 107 (H) 10/04/2021 1206  ? GLUCOSE 109 (H) 09/24/2013 1419  ? BUN 14 10/04/2021 1206  ? BUN 14 09/24/2013 1419  ? CREATININE 0.89 10/04/2021 1206  ? CREATININE 1.00 05/05/2020 1625  ? CALCIUM 9.2 10/04/2021 1206  ? CALCIUM 8.7 09/24/2013 1419  ? GFRNONAA >60 09/24/2013 1419  ? GFRAA >60 09/24/2013 1419  ? ?HYPERLIPIDEMIA ?Symptoms ?Chest pain on exertion:  no   Leg claudication:   no ?Medications: ?Compliance- not taking medication ?Lipid Panel  ?   ?Component Value Date/Time  ? CHOL 212 (H) 11/20/2021 2671  ? TRIG 350.0 (H) 11/20/2021 2458  ? HDL 39.20 11/20/2021 0942  ? CHOLHDL 5 11/20/2021 0942  ? VLDL 70.0 (H) 11/20/2021 0942  ? [place]  05/05/2020 1625  ?   Comment:  ?   . ?LDL cholesterol not calculated. Triglyceride levels ?greater than 400 mg/dL invalidate calculated LDL results. ?. ?Reference range: <100 ?Marland Kitchen ?Desirable range <100 mg/dL for primary prevention;   ?<70 mg/dL for patients with CHD or diabetic patients  ?with > or = 2 CHD risk factors. ?. ?LDL-C is now calculated using the Martin-Hopkins  ?calculation, which is a validated novel method providing  ?better accuracy than the Friedewald equation in the  ?estimation of LDL-C.  ?Cresenciano Genre et al. Annamaria Helling. 0998;338(25): 2061-2068  ?(http://education.[employer].com/faq/FAQ164) ?  ? LDLDIRECT 145.0 11/20/2021 0942  ? ?The 10-year ASCVD risk score (Arnett DK, et al., 2019) is: 7.1% ?  Values used to calculate the score: ?    Age: 77  years ?    Sex: Male ?    Is Non-Hispanic African American: No ?    Diabetic: No ?    Tobacco smoker: No ?    Systolic Blood Pressure: 053 mmHg ?    Is BP treated: Yes ?    HDL Cholesterol: 39.2 mg/dL ?    Total Cholesterol: 212 mg/dL ? ?GERD/stomach discomfort: Patient was previously seen by one of our nurse practitioners.  She placed the patient on omeprazole and Carafate.  He was also placed on cimetidine.  He notes his symptoms have improved some.  He denies blood in his stool.  He occasionally has dysphagia.  He does have some reduced appetite that has been going on a while.  He reports he had a reaction to the propofol with his colonoscopy and they advised that he should have any future procedures like that at [facility] or [facility]. ? ?Abnormal semen consistency: Patient reports his semen is like water when he ejaculates.  He does not ejaculate often.  He notes no blood in his ejaculate. ? ?Depression: Patient notes chronic depression.  He feels as though he will always battled depression.  He notes the Prozac seems to be fairly beneficial.  He notes no anxiety.  No SI.  He has 4-5 alcoholic beverages nightly. ? ?Social History  ? ?Tobacco Use  ?Smoking Status Never  ?  Smokeless Tobacco Never  ? ? ?Current Outpatient Medications on File Prior to Visit  ?Medication Sig Dispense Refill  ? FLUoxetine (PROZAC) 40 MG capsule Take 1 capsule by mouth once daily 90 capsule 1  ? lisinopril-hydrochlorothiazide (ZESTORETIC) 20-25 MG tablet Take 1 tablet by mouth once daily 90 tablet 1  ? tadalafil (CIALIS) 20 MG tablet TAKE 1 TABLET BY MOUTH ONCE DAILY AS NEEDED FOR ERECTILE DYSFUNCTION 30 tablet 0  ? tamsulosin (FLOMAX) 0.4 MG CAPS capsule Take 2 capsules (0.8 mg total) by mouth daily. 180 capsule 3  ? Testosterone (ANDROGEL) 20.25 MG/1.25GM (1.62%) GEL Apply 3 Pump topically daily. 300 g 1  ? Testosterone 1.62 % GEL SMARTSIG:3 Pump Topical Daily    ? ?No current facility-administered medications on file prior to visit.   ? ? ? ?ROS see history of present illness ? ?Objective ? ?Physical Exam ?Vitals:  ? 12/03/21 1403  ?BP: 120/80  ?Pulse: 91  ?Temp: 98.1 ?F (36.7 ?C)  ?SpO2: 96%  ? ? ?BP Readings from Last 3 Encounters:  ?12/03/21 120/80  ?10/11/21 130/88  ?10/04/21 (!) 150/82  ? ?Wt Readings from Last 3 Encounters:  ?12/03/21 209 lb (94.8 kg)  ?10/11/21 211 lb (95.7 kg)  ?10/04/21 212 lb 3.2 oz (96.3 kg)  ? ? ?Physical Exam ?Constitutional:   ?   General: He is not in acute distress. ?   Appearance: He is not diaphoretic.  ?Cardiovascular:  ?   Rate and Rhythm: Normal rate and regular rhythm.  ?   Heart sounds: Normal heart sounds.  ?Pulmonary:  ?   Effort: Pulmonary effort is normal.  ?   Breath sounds: Normal breath sounds.  ?Abdominal:  ?   General: Bowel sounds are normal. There is no distension.  ?   Palpations: Abdomen is soft.  ?   Tenderness: There is no abdominal tenderness. There is no guarding or rebound.  ?Skin: ?   General: Skin is warm and dry.  ?Neurological:  ?   Mental Status: He is alert.  ? ? ? ?Assessment/Plan: Please see individual problem list. ? ?Problem List Items Addressed This Visit   ? ? Benign essential hypertension  ?  Generally stable and generally controlled.  He will continue lisinopril-HCTZ 1 tablet daily. ?  ?  ? Relevant Medications  ? rosuvastatin (CRESTOR) 20 MG tablet  ? Depression, recurrent ([place])  ?  Chronic issue.  Adequately controlled.  He will continue Prozac 40 mg once daily. ?  ?  ? Excessive drinking of alcohol  ?  Discussed reduction of alcohol intake.  Discussed this could be contributing to depression, reflux issues, cholesterol issues, and blood pressure issues.  Discussed gradually reducing his alcohol intake given the risk of alcohol withdrawal causing death and seizures. ?  ?  ? Gastroesophageal reflux disease  ?  Discussed the need for him to see GI to consider an endoscopy.  Advised that we need to rule out a lesion contributing to his dysphagia.  Discussed the lesion could  be benign or cancerous.  He will continue omeprazole 40 mg once daily.  Refer to GI at [facility] in [place]. ?  ?  ? Relevant Medications  ? omeprazole (PRILOSEC) 40 MG capsule  ? Other Relevant Orders  ? Ambulatory referral to Gastroenterology  ? Direct LDL  ? HLD (hyperlipidemia) - Primary  ?  Discussed the need to start on Crestor to reduce his risk of stroke and heart attack in the future.  He will start Crestor 20  mg once daily.  He will have lab work in 6 weeks. ?  ?  ? Relevant Medications  ? rosuvastatin (CRESTOR) 20 MG tablet  ? Other Relevant Orders  ? Hepatic function panel  ? Direct LDL  ? Hepatic function panel  ? Semen abnormality  ?  Patient reports watery semen.  I encouraged him to see his urologist regarding this. ?  ?  ? ? ? ?Return in about 6 weeks (around 01/14/2022) for Labs, 3 months PCP. ? ?This visit occurred during the SARS-CoV-2 public health emergency.  Safety protocols were in place, including screening questions prior to the visit, additional usage of staff PPE, and extensive cleaning of exam room while observing appropriate contact time as indicated for disinfecting solutions.  ? ? ?[name], MD ?[place] ? ?

## 2021-12-04 LAB — HEPATIC FUNCTION PANEL
ALT: 27 U/L (ref 0–53)
AST: 18 U/L (ref 0–37)
Albumin: 4.3 g/dL (ref 3.5–5.2)
Alkaline Phosphatase: 58 U/L (ref 39–117)
Bilirubin, Direct: 0.1 mg/dL (ref 0.0–0.3)
Total Bilirubin: 0.6 mg/dL (ref 0.2–1.2)
Total Protein: 7.4 g/dL (ref 6.0–8.3)

## 2021-12-13 ENCOUNTER — Other Ambulatory Visit: Payer: 59

## 2021-12-13 ENCOUNTER — Encounter: Payer: Self-pay | Admitting: Oncology

## 2021-12-13 DIAGNOSIS — N5201 Erectile dysfunction due to arterial insufficiency: Secondary | ICD-10-CM

## 2021-12-13 DIAGNOSIS — N401 Enlarged prostate with lower urinary tract symptoms: Secondary | ICD-10-CM | POA: Diagnosis not present

## 2021-12-13 DIAGNOSIS — E349 Endocrine disorder, unspecified: Secondary | ICD-10-CM | POA: Diagnosis not present

## 2021-12-13 DIAGNOSIS — N138 Other obstructive and reflux uropathy: Secondary | ICD-10-CM | POA: Diagnosis not present

## 2021-12-14 ENCOUNTER — Other Ambulatory Visit: Payer: 59

## 2021-12-14 LAB — HEMOGLOBIN AND HEMATOCRIT, BLOOD
Hematocrit: 47.4 % (ref 37.5–51.0)
Hemoglobin: 16.3 g/dL (ref 13.0–17.7)

## 2021-12-14 LAB — PSA: Prostate Specific Ag, Serum: 0.6 ng/mL (ref 0.0–4.0)

## 2021-12-14 LAB — TESTOSTERONE: Testosterone: 1324 ng/dL — ABNORMAL HIGH (ref 264–916)

## 2021-12-17 NOTE — Progress Notes (Signed)
? ?11/25/2019 ?2:01 PM  ? ?Mark Parsons. ?01-29-1966 ?[id number] ? ?Referring provider: Leone Haven, MD ?[address] ?[address] ?[place],  [place] [id number] ? ?Chief Complaint  ?Patient presents with  ? Benign Prostatic Hypertrophy  ? Erectile Dysfunction  ? Testosterone deficiency  ? ?Urological history: ?1. Testosterone deficiency ?-contributing factors of age, hyperglycemia, obesity, chronic pain medication and alcohol abuse. ?-testosterone 1324 in 12/2021 ?-H & H WNL in 12/2021 ?-managed with testosterone topical gel 1.62%, 3 pumps ? ?2. Erythrocytosis ?-managed with periodic phlebotomy ? ?3. BPH with LU TS ?-PSA 0.6 in 12/2021 ?-I PSS 13/4 ?-managed with tamsulosin 0.4 mg, two capsules daily  ? ?4. ED ?-contributory factors of age, BPH, testosterone deficiency, Peyronie's disease, alcohol abuse, smoking, antidepressants and pain medication  ?-SHIM 19 ?-managed with tadalafil 20 mg, on-demand-dosing ? ?5. Peyronie's disease ?-underwent interferon injections ~ 10 years ago  ? ?HPI: ?Mark Parsons. is a 56 y.o. male who presents today for follow up.  ? ?He is having worsening of his urinary frequency even with the 0.8 mg of tamsulosin daily.  Patient denies any modifying or aggravating factors.  Patient denies any gross hematuria, dysuria or suprapubic/flank pain.  Patient denies any fevers, chills, nausea or vomiting.   ? ? IPSS   ? ? [place] Name 12/18/21 1300  ?  ?  ?  ? International Prostate Symptom Score  ? How often have you had the sensation of not emptying your bladder? About half the time    ? How often have you had to urinate less than every two hours? About half the time    ? How often have you found you stopped and started again several times when you urinated? Less than 1 in 5 times    ? How often have you found it difficult to postpone urination? About half the time    ? How often have you had a weak urinary stream? Not at All    ? How often have you had to strain to start urination? Not  at All    ? How many times did you typically get up at night to urinate? 3 Times    ? Total IPSS Score 13    ?  ? Quality of Life due to urinary symptoms  ? If you were to spend the rest of your life with your urinary condition just the way it is now how would you feel about that? Mostly Disatisfied    ? ?  ?  ? ?  ? ? ? ? ?Score:  ?1-7 Mild ?8-19 Moderate ?20-35 Severe ? ?He still has issues with Peyronie's disease making it difficult to initiate relationships.  He does have good erections with tadalafil. ? SHIM   ? ? [place] Name 12/18/21 1313  ?  ?  ?  ? SHIM: Over the last 6 months:  ? How do you rate your confidence that you could get and keep an erection? High    ? When you had erections with sexual stimulation, how often were your erections hard enough for penetration (entering your partner)? Most Times (much more than half the time)    ? During sexual intercourse, how often were you able to maintain your erection after you had penetrated (entered) your partner? Most Times (much more than half the time)    ? During sexual intercourse, how difficult was it to maintain your erection to completion of intercourse? Slightly Difficult    ? When you  attempted sexual intercourse, how often was it satisfactory for you? Sometimes (about half the time)    ?  ? SHIM Total Score  ? SHIM 19    ? ?  ?  ? ?  ? ? ? ? ?Score: ?1-7 Severe ED ?8-11 Moderate ED ?12-16 Mild-Moderate ED ?17-21 Mild ED ?22-25 No ED ? ? ? ?PMH: ?Past Medical History:  ?Diagnosis Date  ? Depression   ? Hypertension   ? Low testosterone in male   ? ? ?Surgical History: ?No past surgical history on file. ? ?Home Medications:  ?Allergies as of 12/18/2021   ? ?   Reactions  ? Propofol Anaphylaxis  ? Near death  ? ?  ? ?  ?Medication List  ?  ? ?  ? Accurate as of December 18, 2021  2:01 PM. If you have any questions, ask your nurse or doctor.  ?  ?  ? ?  ? ?FLUoxetine 40 MG capsule ?Commonly known as: PROZAC ?Take 1 capsule by mouth once daily ?   ?lisinopril-hydrochlorothiazide 20-25 MG tablet ?Commonly known as: ZESTORETIC ?Take 1 tablet by mouth once daily ?  ?omeprazole 40 MG capsule ?Commonly known as: PRILOSEC ?Take 1 capsule (40 mg total) by mouth daily. ?  ?rosuvastatin 20 MG tablet ?Commonly known as: Crestor ?Take 1 tablet (20 mg total) by mouth daily. ?  ?tadalafil 20 MG tablet ?Commonly known as: CIALIS ?Take 1 tablet (20 mg total) by mouth daily as needed for erectile dysfunction. ?  ?tamsulosin 0.4 MG Caps capsule ?Commonly known as: FLOMAX ?Take 2 capsules (0.8 mg total) by mouth daily. ?  ?Testosterone 20.25 MG/1.25GM (1.62%) Gel ?Commonly known as: AndroGel ?Apply 3 Pump topically daily. ?  ?Testosterone 1.62 % Gel ?SMARTSIG:3 Pump Topical Daily ?  ? ?  ? ? ?Allergies:  ?Allergies  ?Allergen Reactions  ? Propofol Anaphylaxis  ?  Near death  ? ? ?Family History: ?Family History  ?Problem Relation Age of Onset  ? Colon cancer Mother   ? Multiple myeloma Father   ? Aortic aneurysm Father   ? Stroke Father   ? ? ?Social History:  reports that he has never smoked. He has never used smokeless tobacco. He reports current alcohol use of about 40.0 standard drinks per week. He reports that he does not use drugs. ? ?ROS: ?For pertinent review of systems please refer to history of present illness ? ?Physical Exam: ?BP 130/82   Pulse 63   Ht _0  (1.778 m)   Wt 200 lb (90.7 kg)   BMI 28.70 kg/m?   ?Constitutional:  Well nourished. Alert and oriented, No acute distress. ?HEENT: [place] AT, moist mucus membranes.  Trachea midline ?Cardiovascular: No clubbing, cyanosis, or edema. ?Respiratory: Normal respiratory effort, no increased work of breathing. ?GU: No CVA tenderness.  No bladder fullness or masses.  Patient with circumcised phallus.   Urethral meatus is patent.  No penile discharge. No penile lesions or rashes. Scrotum without lesions, cysts, rashes and/or edema.  Testicles are located scrotally bilaterally. No masses are appreciated in the  testicles. Left and right epididymis are normal. ?Rectal: Patient with  normal sphincter tone. Anus and perineum without scarring or rashes. No rectal masses are appreciated. Prostate is approximately 50 + grams, could only palpate the apex, no nodules are appreciated. Seminal vesicles could not be palpated. ?Neurologic: Grossly intact, no focal deficits, moving all 4 extremities. ?Psychiatric: Normal mood and affect.  ? ?Laboratory Data ?Results for orders placed or performed in  visit on 12/13/21  ?Testosterone  ?Result Value Ref Range  ? Testosterone 1,324 (H) 264 - 916 ng/dL  ?Hemoglobin and hematocrit, blood  ?Result Value Ref Range  ? Hemoglobin 16.3 13.0 - 17.7 g/dL  ? Hematocrit 47.4 37.5 - 51.0 %  ?PSA  ?Result Value Ref Range  ? Prostate Specific Ag, Serum 0.6 0.0 - 4.0 ng/mL  ? ? ?  Latest Ref Rng & Units 12/13/2021  ? 10:44 AM 11/20/2021  ?  9:42 AM 10/04/2021  ? 12:06 PM  ?CBC  ?WBC 4.0 - 10.5 K/uL  7.4   6.8    ?Hemoglobin 13.0 - 17.7 g/dL 16.3   14.8   16.0    ?Hematocrit 37.5 - 51.0 % 47.4   44.3   48.3    ?Platelets 150.0 - 400.0 K/uL  269.0   232.0    ?  ? ?  Latest Ref Rng & Units 12/03/2021  ?  2:34 PM 11/20/2021  ?  9:42 AM 10/04/2021  ? 12:06 PM  ?CMP  ?Glucose 70 - 99 mg/dL   107    ?BUN 6 - 23 mg/dL   14    ?Creatinine 0.40 - 1.50 mg/dL   0.89    ?Sodium 135 - 145 mEq/L   136    ?Potassium 3.5 - 5.1 mEq/L   3.5    ?Chloride 96 - 112 mEq/L   100    ?CO2 19 - 32 mEq/L   28    ?Calcium 8.4 - 10.5 mg/dL   9.2    ?Total Protein 6.0 - 8.3 g/dL 7.4   6.8   7.3    ?Total Bilirubin 0.2 - 1.2 mg/dL 0.6   0.5   0.5    ?Alkaline Phos 39 - 117 U/L 58   59   73    ?AST 0 - 37 U/L _0 ?ALT 0 - 53 U/L _1 ?  ?Component ?    Latest Ref Rng 11/20/2021  ?Cholesterol ?    0 - 200 mg/dL 212 (H)   ?Triglycerides ?    0.0 - 149.0 mg/dL 350.0 (H)   ?HDL Cholesterol ?    >39.00 mg/dL 39.20   ?VLDL ?    0.0 - 40.0 mg/dL 70.0 (H)   ?Total CHOL/HDL Ratio 5   ?NonHDL 172.92   ?  ?Component ?    Latest Ref  Rng 12/03/2021  ?Total Bilirubin ?    0.2 - 1.2 mg/dL 0.6   ?Alkaline Phosphatase ?    39 - 117 U/L 58   ?AST ?    0 - 37 U/L 18   ?ALT ?    0 - 53 U/L 27   ?Total Protein ?    6.0 - 8.3 g/dL 7.4   ?Albumin ?    3.5 - 5.2 g/dL 4

## 2021-12-18 ENCOUNTER — Ambulatory Visit: Payer: 59 | Admitting: Gastroenterology

## 2021-12-18 ENCOUNTER — Ambulatory Visit: Payer: 59 | Admitting: Urology

## 2021-12-18 ENCOUNTER — Encounter: Payer: Self-pay | Admitting: Oncology

## 2021-12-18 ENCOUNTER — Ambulatory Visit: Payer: Self-pay | Admitting: Gastroenterology

## 2021-12-18 ENCOUNTER — Encounter: Payer: Self-pay | Admitting: Urology

## 2021-12-18 VITALS — BP 130/82 | HR 63 | Ht 70.0 in | Wt 200.0 lb

## 2021-12-18 DIAGNOSIS — E349 Endocrine disorder, unspecified: Secondary | ICD-10-CM | POA: Diagnosis not present

## 2021-12-18 DIAGNOSIS — N5201 Erectile dysfunction due to arterial insufficiency: Secondary | ICD-10-CM | POA: Diagnosis not present

## 2021-12-18 DIAGNOSIS — N138 Other obstructive and reflux uropathy: Secondary | ICD-10-CM

## 2021-12-18 DIAGNOSIS — N486 Induration penis plastica: Secondary | ICD-10-CM

## 2021-12-18 DIAGNOSIS — N401 Enlarged prostate with lower urinary tract symptoms: Secondary | ICD-10-CM

## 2021-12-18 MED ORDER — TADALAFIL 20 MG PO TABS: 20.0000 mg | ORAL_TABLET | Freq: Every day | ORAL | 0 refills | Status: DC | PRN

## 2022-01-11 ENCOUNTER — Encounter: Payer: 59 | Admitting: Urology

## 2022-01-14 ENCOUNTER — Other Ambulatory Visit (INDEPENDENT_AMBULATORY_CARE_PROVIDER_SITE_OTHER): Payer: 59

## 2022-01-14 DIAGNOSIS — K219 Gastro-esophageal reflux disease without esophagitis: Secondary | ICD-10-CM | POA: Diagnosis not present

## 2022-01-14 DIAGNOSIS — E785 Hyperlipidemia, unspecified: Secondary | ICD-10-CM | POA: Diagnosis not present

## 2022-01-14 LAB — HEPATIC FUNCTION PANEL
ALT: 23 U/L (ref 0–53)
AST: 18 U/L (ref 0–37)
Albumin: 4.2 g/dL (ref 3.5–5.2)
Alkaline Phosphatase: 54 U/L (ref 39–117)
Bilirubin, Direct: 0.1 mg/dL (ref 0.0–0.3)
Total Bilirubin: 0.4 mg/dL (ref 0.2–1.2)
Total Protein: 7.4 g/dL (ref 6.0–8.3)

## 2022-01-14 LAB — LDL CHOLESTEROL, DIRECT: Direct LDL: 92 mg/dL

## 2022-01-21 NOTE — Progress Notes (Signed)
Letter mailed to patient to call the office for lab results.  Marykatherine Sherwood,cma  ?

## 2022-02-08 ENCOUNTER — Ambulatory Visit (INDEPENDENT_AMBULATORY_CARE_PROVIDER_SITE_OTHER): Payer: 59 | Admitting: Urology

## 2022-02-08 ENCOUNTER — Encounter: Payer: Self-pay | Admitting: Urology

## 2022-02-08 VITALS — BP 140/72 | HR 80 | Ht 70.0 in | Wt 205.0 lb

## 2022-02-08 DIAGNOSIS — N401 Enlarged prostate with lower urinary tract symptoms: Secondary | ICD-10-CM

## 2022-02-08 DIAGNOSIS — E349 Endocrine disorder, unspecified: Secondary | ICD-10-CM | POA: Diagnosis not present

## 2022-02-08 DIAGNOSIS — N138 Other obstructive and reflux uropathy: Secondary | ICD-10-CM

## 2022-02-08 DIAGNOSIS — R350 Frequency of micturition: Secondary | ICD-10-CM

## 2022-02-08 LAB — URINALYSIS, COMPLETE
Bilirubin, UA: NEGATIVE
Glucose, UA: NEGATIVE
Ketones, UA: NEGATIVE
Leukocytes,UA: NEGATIVE
Nitrite, UA: NEGATIVE
RBC, UA: NEGATIVE
Specific Gravity, UA: 1.025 (ref 1.005–1.030)
Urobilinogen, Ur: 0.2 mg/dL (ref 0.2–1.0)
pH, UA: 5.5 (ref 5.0–7.5)

## 2022-02-08 LAB — MICROSCOPIC EXAMINATION
Bacteria, UA: NONE SEEN
Epithelial Cells (non renal): NONE SEEN /hpf (ref 0–10)

## 2022-02-08 MED ORDER — MIRABEGRON ER 50 MG PO TB24: 50.0000 mg | ORAL_TABLET | Freq: Every day | ORAL | 0 refills | Status: DC

## 2022-02-09 LAB — TESTOSTERONE: Testosterone: 939 ng/dL — ABNORMAL HIGH (ref 264–916)

## 2022-02-10 NOTE — Progress Notes (Signed)
02/10/22  Chief Complaint  Patient presents with   Cysto     HPI: Refer to Shannon's note 12/18/2021.  Most bothersome voiding symptoms is urinary frequency.  No improvement on tamsulosin 0.8 mg for the last few months   Please see previous notes for details.     Blood pressure 140/72, pulse 80, height '5\' 10"'$  (1.778 m), weight 205 lb (93 kg). NED. A&Ox3.   No respiratory distress   Abd soft, NT, ND Normal phallus with bilateral descended testicles    Cystoscopy Procedure Note  Patient identification was confirmed, informed consent was obtained, and patient was prepped using Betadine solution.  Lidocaine jelly was administered per urethral meatus.    Pre-Procedure: - Inspection reveals a normal caliber urethral meatus.  Procedure: The flexible cystoscope was introduced without difficulty - No urethral strictures/lesions are present. -  Mild lateral lobe enlargement  prostate-nonocclusive - Normal bladder neck - Bilateral ureteral orifices identified - Bladder mucosa  reveals no ulcers, tumors, or lesions - No bladder stones - No trabeculation  Retroflexion shows no abnormalities   Post-Procedure: - Patient tolerated the procedure well   Prostate transrectal ultrasound sizing   Informed consent was obtained after discussing risks/benefits of the procedure.  A time out was performed to ensure correct patient identity.   Pre-Procedure: -Transrectal probe was placed without difficulty -Transrectal Ultrasound performed revealing a 28 gm prostate measuring 4.4 x 2.9 x 4 cm (length) -No significant hypoechoic or median lobe noted      Assessment/ Plan: No significant BPH on cystoscopy/TRUS Trial Myrbetriq 50 mg daily-samples given Follow-up visit with Larene [facility] 1 month for symptom recheck He is not interested in Peyronie's treatment at this time   Abbie Sons, MD

## 2022-03-05 ENCOUNTER — Ambulatory Visit: Payer: 59 | Admitting: Family Medicine

## 2022-03-15 ENCOUNTER — Ambulatory Visit: Payer: 59 | Admitting: Urology

## 2022-03-15 DIAGNOSIS — R350 Frequency of micturition: Secondary | ICD-10-CM

## 2022-04-04 ENCOUNTER — Other Ambulatory Visit: Payer: Self-pay | Admitting: Family Medicine

## 2022-04-04 DIAGNOSIS — I10 Essential (primary) hypertension: Secondary | ICD-10-CM

## 2022-04-08 ENCOUNTER — Ambulatory Visit (INDEPENDENT_AMBULATORY_CARE_PROVIDER_SITE_OTHER): Payer: 59 | Admitting: Family Medicine

## 2022-04-08 ENCOUNTER — Encounter: Payer: Self-pay | Admitting: Family Medicine

## 2022-04-08 VITALS — BP 120/80 | HR 97 | Temp 98.2°F | Ht 70.0 in | Wt 217.6 lb

## 2022-04-08 DIAGNOSIS — E291 Testicular hypofunction: Secondary | ICD-10-CM

## 2022-04-08 DIAGNOSIS — I10 Essential (primary) hypertension: Secondary | ICD-10-CM

## 2022-04-08 DIAGNOSIS — N3281 Overactive bladder: Secondary | ICD-10-CM

## 2022-04-08 DIAGNOSIS — E785 Hyperlipidemia, unspecified: Secondary | ICD-10-CM

## 2022-04-08 DIAGNOSIS — K219 Gastro-esophageal reflux disease without esophagitis: Secondary | ICD-10-CM

## 2022-04-08 DIAGNOSIS — R1013 Epigastric pain: Secondary | ICD-10-CM

## 2022-04-08 DIAGNOSIS — E66811 Obesity, class 1: Secondary | ICD-10-CM

## 2022-04-08 DIAGNOSIS — Z1211 Encounter for screening for malignant neoplasm of colon: Secondary | ICD-10-CM

## 2022-04-08 DIAGNOSIS — E669 Obesity, unspecified: Secondary | ICD-10-CM

## 2022-04-08 LAB — BASIC METABOLIC PANEL
BUN: 10 mg/dL (ref 6–23)
CO2: 26 mEq/L (ref 19–32)
Calcium: 9 mg/dL (ref 8.4–10.5)
Chloride: 99 mEq/L (ref 96–112)
Creatinine, Ser: 0.94 mg/dL (ref 0.40–1.50)
GFR: 91.59 mL/min (ref >60.00)
Glucose, Bld: 103 mg/dL — ABNORMAL HIGH (ref 70–99)
Potassium: 4 mEq/L (ref 3.5–5.1)
Sodium: 135 mEq/L (ref 135–145)

## 2022-04-08 NOTE — Assessment & Plan Note (Addendum)
Patient notes ongoing issues with some epigastric abdominal discomfort.  This has been going on for over a year per his report.  He thinks omeprazole may have helped some.  Given the persistence of this we will get a CT scan to evaluate for an underlying cause of it.  He will also be seeing GI for further evaluation as well.  He was advised to check with his insurance on potential cost to him once the scheduler contact him to schedule this scan.

## 2022-04-08 NOTE — Progress Notes (Signed)
Mark Rumps, MD Phone: [phone]  Mark Parsons. is a 56 y.o. male who presents today for f/u.  HYPERTENSION Disease Monitoring Home BP Monitoring "good"  Chest pain- no    Dyspnea- no Medications Compliance-  taking lisinopril/HCTZ.   Edema- no BMET    Component Value Date/Time   NA 136 10/04/2021 1206   NA 134 (L) 09/24/2013 1419   K 3.5 10/04/2021 1206   K 3.8 09/24/2013 1419   CL 100 10/04/2021 1206   CL 102 09/24/2013 1419   CO2 28 10/04/2021 1206   CO2 27 09/24/2013 1419   GLUCOSE 107 (H) 10/04/2021 1206   GLUCOSE 109 (H) 09/24/2013 1419   BUN 14 10/04/2021 1206   BUN 14 09/24/2013 1419   CREATININE 0.89 10/04/2021 1206   CREATININE 1.00 05/05/2020 1625   CALCIUM 9.2 10/04/2021 1206   CALCIUM 8.7 09/24/2013 1419   GFRNONAA >60 09/24/2013 1419   GFRAA >60 09/24/2013 1419   HYPERLIPIDEMIA Symptoms Chest pain on exertion:  no   Medications: Compliance- taking crestor Right upper quadrant pain- no  Muscle aches- no Lipid Panel     Component Value Date/Time   CHOL 212 (H) 11/20/2021 0942   TRIG 350.0 (H) 11/20/2021 0942   HDL 39.20 11/20/2021 0942   CHOLHDL 5 11/20/2021 0942   VLDL 70.0 (H) 11/20/2021 0942   LDLCALC  05/05/2020 1625     Comment:     . LDL cholesterol not calculated. Triglyceride levels greater than 400 mg/dL invalidate calculated LDL results. . Reference range: <100 . Desirable range <100 mg/dL for primary prevention;   <70 mg/dL for patients with CHD or diabetic patients  with > or = 2 CHD risk factors. Marland Kitchen LDL-C is now calculated using the Martin-Hopkins  calculation, which is a validated novel method providing  better accuracy than the Friedewald equation in the  estimation of LDL-C.  Cresenciano Genre et al. Annamaria Helling. 3244;010(27): 2061-2068  (http://education.[facility].com/faq/FAQ164)    LDLDIRECT 92.0 01/14/2022 1344   GERD:   Reflux symptoms: no   Abd pain: some epigastric sensation like he needs to pass gas, somewhat  improved recently, notes this has been going on for greater than a year.   Blood in stool: no  Dysphagia: no   EGD: no  Medication: omeprazole  Overactive bladder: Patient notes he took Myrbetriq for 4 weeks.  He is unsure if it was beneficial.  Hypogonadism: Patient is following with urology.  He is on AndroGel.  He notes this has been beneficial.  He notes his hematocrit has come down quite a bit with this compared to the testosterone injections.  He wonders if I can take over his testosterone supplementation.  Obesity: Patient notes he has been working on portion control given his recent epigastric stomach issues.  He walks some though not consistently.  Social History   Tobacco Use  Smoking Status Never  Smokeless Tobacco Never    Current Outpatient Medications on File Prior to Visit  Medication Sig Dispense Refill   FLUoxetine (PROZAC) 40 MG capsule Take 1 capsule by mouth once daily 90 capsule 1   lisinopril-hydrochlorothiazide (ZESTORETIC) 20-25 MG tablet Take 1 tablet by mouth once daily 90 tablet 1   mirabegron ER (MYRBETRIQ) 50 MG TB24 tablet Take 1 tablet (50 mg total) by mouth daily. 30 tablet 0   omeprazole (PRILOSEC) 40 MG capsule Take 1 capsule (40 mg total) by mouth daily. 30 capsule 3   rosuvastatin (CRESTOR) 20 MG tablet Take 1 tablet (20 mg  total) by mouth daily. 90 tablet 3   tadalafil (CIALIS) 20 MG tablet Take 1 tablet (20 mg total) by mouth daily as needed for erectile dysfunction. 30 tablet 0   tamsulosin (FLOMAX) 0.4 MG CAPS capsule Take 2 capsules (0.8 mg total) by mouth daily. 180 capsule 3   Testosterone (ANDROGEL) 20.25 MG/1.25GM (1.62%) GEL Apply 3 Pump topically daily. 300 g 1   No current facility-administered medications on file prior to visit.     ROS see history of present illness  Objective  Physical Exam Vitals:   04/08/22 1048  BP: 120/80  Pulse: 97  Temp: 98.2 F (36.8 C)  SpO2: 97%    BP Readings from Last 3 Encounters:   04/08/22 120/80  02/08/22 140/72  12/18/21 130/82   Wt Readings from Last 3 Encounters:  04/08/22 217 lb 9.6 oz (98.7 kg)  02/08/22 205 lb (93 kg)  12/18/21 200 lb (90.7 kg)    Physical Exam Constitutional:      General: He is not in acute distress.    Appearance: He is not diaphoretic.  Cardiovascular:     Rate and Rhythm: Normal rate and regular rhythm.     Heart sounds: Normal heart sounds.  Pulmonary:     Effort: Pulmonary effort is normal.     Breath sounds: Normal breath sounds.  Abdominal:     General: Bowel sounds are normal. There is no distension.     Palpations: Abdomen is soft.     Tenderness: There is no abdominal tenderness.  Skin:    General: Skin is warm and dry.  Neurological:     Mental Status: He is alert.      Assessment/Plan: Please see individual problem list.  Problem List Items Addressed This Visit     Benign essential hypertension (Chronic)    Adequately controlled.  He will continue lisinopril-HCTZ 20-25 mg daily.  Check BMP.      Relevant Orders   Basic Metabolic Panel (BMET)   Epigastric abdominal pain (Chronic)    Patient notes ongoing issues with some epigastric abdominal discomfort.  This has been going on for over a year per his report.  He thinks omeprazole may have helped some.  Given the persistence of this we will get a CT scan to evaluate for an underlying cause of it.  He will also be seeing GI for further evaluation as well.  He was advised to check with his insurance on potential cost to him once the scheduler contact him to schedule this scan.      Relevant Orders   CT Abdomen Pelvis W Contrast   Gastroesophageal reflux disease - Primary (Chronic)    No reflux though does have some mild epigastric issues.  Discussed the need to see GI to consider EGD versus other work-up.  He was previously referred though notes he never heard from them.  I will place another referral.  He will continue omeprazole 40 mg daily.       Relevant Orders   Ambulatory referral to Gastroenterology   HLD (hyperlipidemia) (Chronic)    Well-controlled.  Continue Crestor 20 mg daily.      Hypogonadism in male (Chronic)    I discussed that I would like for him to continue to see urology for this issue given that testosterone is a high risk medication that I do not manage.      Overactive bladder (Chronic)    Patient completed a sample course of Myrbetriq.  He is unsure if it  was beneficial.  He will continue to follow-up with urology.      Obesity (BMI 30.0-34.9)    I encouraged continued portion control.  Discussed adding in walking for exercise 3 to 4 days a week for 30 minutes at a time.      Other Visit Diagnoses     Colon cancer screening       Relevant Orders   Ambulatory referral to Gastroenterology        Return in about 6 months (around 10/09/2022) for Hypertension.   Mark Rumps, MD [place]

## 2022-04-08 NOTE — Assessment & Plan Note (Signed)
No reflux though does have some mild epigastric issues.  Discussed the need to see GI to consider EGD versus other work-up.  He was previously referred though notes he never heard from them.  I will place another referral.  He will continue omeprazole 40 mg daily.

## 2022-04-08 NOTE — Patient Instructions (Signed)
Nice to see you. If you do not hear from GI within the next 2 weeks please contact us. We will get some lab work today and contact you with the results. Please start walking for exercise.

## 2022-04-08 NOTE — Assessment & Plan Note (Signed)
Well-controlled.  Continue Crestor 20 mg daily.

## 2022-04-08 NOTE — Assessment & Plan Note (Signed)
I encouraged continued portion control.  Discussed adding in walking for exercise 3 to 4 days a week for 30 minutes at a time.

## 2022-04-08 NOTE — Assessment & Plan Note (Signed)
I discussed that I would like for him to continue to see urology for this issue given that testosterone is a high risk medication that I do not manage.

## 2022-04-08 NOTE — Assessment & Plan Note (Signed)
Adequately controlled.  He will continue lisinopril-HCTZ 20-25 mg daily.  Check BMP.

## 2022-04-08 NOTE — Assessment & Plan Note (Signed)
Patient completed a sample course of Myrbetriq.  He is unsure if it was beneficial.  He will continue to follow-up with urology.

## 2022-04-15 ENCOUNTER — Other Ambulatory Visit: Payer: Self-pay | Admitting: Urology

## 2022-04-15 DIAGNOSIS — E349 Endocrine disorder, unspecified: Secondary | ICD-10-CM

## 2022-04-17 ENCOUNTER — Telehealth: Payer: Self-pay | Admitting: Urology

## 2022-04-17 ENCOUNTER — Other Ambulatory Visit: Payer: Self-pay | Admitting: *Deleted

## 2022-04-17 ENCOUNTER — Telehealth: Payer: Self-pay | Admitting: Family Medicine

## 2022-04-17 MED ORDER — MIRABEGRON ER 50 MG PO TB24: 50.0000 mg | ORAL_TABLET | Freq: Every day | ORAL | 12 refills | Status: DC

## 2022-04-17 NOTE — Telephone Encounter (Signed)
Called both numbers. Cell cannot leave vm and home number rings.thanks

## 2022-04-17 NOTE — Telephone Encounter (Signed)
PT would like a new RX for Myrbetriq 50 mg sent to [facility] on [address]

## 2022-04-17 NOTE — Telephone Encounter (Signed)
Medication sent.

## 2022-04-18 ENCOUNTER — Other Ambulatory Visit: Payer: Self-pay | Admitting: Physician Assistant

## 2022-04-25 ENCOUNTER — Ambulatory Visit: Payer: 59 | Admitting: Urology

## 2022-04-25 ENCOUNTER — Encounter: Payer: Self-pay | Admitting: Urology

## 2022-04-25 VITALS — BP 142/78 | HR 89 | Ht 70.0 in | Wt 210.0 lb

## 2022-04-25 DIAGNOSIS — N138 Other obstructive and reflux uropathy: Secondary | ICD-10-CM | POA: Diagnosis not present

## 2022-04-25 DIAGNOSIS — N5201 Erectile dysfunction due to arterial insufficiency: Secondary | ICD-10-CM

## 2022-04-25 DIAGNOSIS — E291 Testicular hypofunction: Secondary | ICD-10-CM | POA: Diagnosis not present

## 2022-04-25 DIAGNOSIS — N401 Enlarged prostate with lower urinary tract symptoms: Secondary | ICD-10-CM | POA: Diagnosis not present

## 2022-04-25 DIAGNOSIS — E349 Endocrine disorder, unspecified: Secondary | ICD-10-CM | POA: Diagnosis not present

## 2022-04-25 LAB — BLADDER SCAN AMB NON-IMAGING: Scan Result: 5

## 2022-04-25 MED ORDER — TADALAFIL 20 MG PO TABS: 20.0000 mg | ORAL_TABLET | Freq: Every day | ORAL | 3 refills | Status: DC | PRN

## 2022-04-25 NOTE — Progress Notes (Signed)
04/25/22 2:51 PM   Mark Parsons. 1965-10-05 [id number]  Referring provider:  Leone Haven, MD [address] [place],  [place] [address] Chief Complaint  Patient presents with   Benign Prostatic Hypertrophy   Testosterone deficiency    Urological history  1. Testosterone deficiency -contributing factors of age, hyperglycemia, obesity, chronic pain medication and alcohol abuse. -testosterone 939 in 02/2022 -H & H WNL in 12/2021 -managed with testosterone topical gel 1.62%, 3 pumps   2. Erythrocytosis -managed with periodic phlebotomy   3. BPH with LU TS -PSA 0.6 in 12/2021 -cysto, 02/2022 -  Mild lateral lobe enlargement  prostate-nonocclusive -TRUS, 02/2022 - 28 grams  -I PSS 9/4 -PVR 5 mL  -managed with tamsulosin 0.4 mg, two capsules daily    4. ED -contributory factors of age, BPH, testosterone deficiency, Peyronie's disease, alcohol abuse, smoking, antidepressants and pain medication  -tadalafil 20 mg, on-demand-dosing   5. Peyronie's disease -underwent interferon injections ~ 10 years ago    HPI: Trendon Zaring. is a 56 y.o.male who returns today for office visit after trial Myrbetriq 50 mg daily.  He states he is doing well with his testosterone gel.  Testosterone levels pending from this appointment.  He was last seen in June when he underwent cystoscopy with Dr. Bernardo Heater and was given Myrbetriq samples.  He does admit that it has been a long time and he cannot remember whether or not the Myrbetriq was effective in helping with his urinary symptoms.  Patient denies any modifying or aggravating factors.  Patient denies any gross hematuria, dysuria or suprapubic/flank pain.  Patient denies any fevers, chills, nausea or vomiting.   PSA is pending for this appointment.  PVR 5 mL   IPSS     Row Name 04/25/22 1100         International Prostate Symptom Score   How often have you had the sensation of not emptying your bladder? Not at  All     How often have you had to urinate less than every two hours? About half the time     How often have you found you stopped and started again several times when you urinated? Not at All     How often have you found it difficult to postpone urination? About half the time     How often have you had a weak urinary stream? Not at All     How often have you had to strain to start urination? Not at All     How many times did you typically get up at night to urinate? 3 Times     Total IPSS Score 9       Quality of Life due to urinary symptoms   If you were to spend the rest of your life with your urinary condition just the way it is now how would you feel about that? Mostly Disatisfied              Score:  1-7 Mild 8-19 Moderate 20-35 Severe      PMH: Past Medical History:  Diagnosis Date   Depression    Hypertension    Low testosterone in male     Surgical History: No past surgical history on file.  Home Medications:  Allergies as of 04/25/2022       Reactions   Propofol Anaphylaxis   Near death        Medication List        Accurate  as of April 25, 2022  2:51 PM. If you have any questions, ask your nurse or doctor.          FLUoxetine 40 MG capsule Commonly known as: PROZAC Take 1 capsule by mouth once daily   lisinopril-hydrochlorothiazide 20-25 MG tablet Commonly known as: ZESTORETIC Take 1 tablet by mouth once daily   mirabegron ER 50 MG Tb24 tablet Commonly known as: MYRBETRIQ Take 1 tablet (50 mg total) by mouth daily.   omeprazole 40 MG capsule Commonly known as: PRILOSEC Take 1 capsule (40 mg total) by mouth daily.   rosuvastatin 20 MG tablet Commonly known as: Crestor Take 1 tablet (20 mg total) by mouth daily.   tadalafil 20 MG tablet Commonly known as: CIALIS Take 1 tablet (20 mg total) by mouth daily as needed for erectile dysfunction.   tamsulosin 0.4 MG Caps capsule Commonly known as: FLOMAX Take 2 capsules (0.8 mg  total) by mouth daily.   Testosterone 1.62 % Gel APPLY 3 PUMP TOPICALLY DAILY        Allergies:  Allergies  Allergen Reactions   Propofol Anaphylaxis    Near death    Family History: Family History  Problem Relation Age of Onset   Colon cancer Mother    Multiple myeloma Father    Aortic aneurysm Father    Stroke Father     Social History:  reports that he has never smoked. He has never used smokeless tobacco. He reports current alcohol use of about 40.0 standard drinks of alcohol per week. He reports that he does not use drugs.   Physical Exam: BP (!) 142/78   Pulse 89   Ht '5\' 10"'  (1.778 m)   Wt 210 lb (95.3 kg)   BMI 30.13 kg/m   Constitutional:  Well nourished. Alert and oriented, No acute distress. HEENT: [place] AT, moist mucus membranes.  Trachea midline Cardiovascular: No clubbing, cyanosis, or edema. Respiratory: Normal respiratory effort, no increased work of breathing. Neurologic: Grossly intact, no focal deficits, moving all 4 extremities. Psychiatric: Normal mood and affect.   Laboratory Data: Lab Results  Component Value Date   CREATININE 0.94 04/08/2022      Latest Ref Rng & Units 04/08/2022   11:01 AM 10/04/2021   12:06 PM 06/04/2021    4:16 PM  BMP  Glucose 70 - 99 mg/dL 103  107  88   BUN 6 - 23 mg/dL '10  14  16   ' Creatinine 0.40 - 1.50 mg/dL 0.94  0.89  1.00   Sodium 135 - 145 mEq/L 135  136  136   Potassium 3.5 - 5.1 mEq/L 4.0  3.5  3.8   Chloride 96 - 112 mEq/L 99  100  98   CO2 19 - 32 mEq/L '26  28  27   ' Calcium 8.4 - 10.5 mg/dL 9.0  9.2  9.7     Legend: (H) High I have reviewed the labs.   Pertinent Imaging:  04/25/22 11:52  Scan Result 5    Assessment & Plan:    1. Testosterone deficiency -testosterone and H & H pending   2. BPH with LUTS -PSA pending -cysto negative for BOO -discontinue the tamsulson  -retrial of Myrbetriq 50 mg daily, # 28  3. ED -tadalafil 20 mg, on-demand-dosing   Return in about 1 month (around  05/26/2022) for IPSS and PVR.  [facility] [address] [place], [place] [address] [phone]  I, Veatrice Kells as a scribe for  Israel Werts, PA-C.,have documented all relevant documentation on the behalf of Josie Mesa, PA-C,as directed by  [facility], PA-C while in the presence of Plummer Matich, PA-C.  I have reviewed the above documentation for accuracy and completeness, and I agree with the above.    Zara Council, PA-C

## 2022-04-26 ENCOUNTER — Telehealth: Payer: Self-pay

## 2022-04-26 LAB — TESTOSTERONE: Testosterone: 953 ng/dL — ABNORMAL HIGH (ref 264–916)

## 2022-04-26 LAB — HEMOGLOBIN AND HEMATOCRIT, BLOOD
Hematocrit: 47.5 % (ref 37.5–51.0)
Hemoglobin: 15.9 g/dL (ref 13.0–17.7)

## 2022-04-26 LAB — PSA: Prostate Specific Ag, Serum: 0.5 ng/mL (ref 0.0–4.0)

## 2022-04-26 NOTE — Telephone Encounter (Signed)
Notified pt as advised, pt expressed understanding.  ?

## 2022-04-26 NOTE — Telephone Encounter (Signed)
-----   Message from Nori Riis, PA-C sent at 04/26/2022  8:11 AM EDT ----- Please let Mr. Michalik know that his lab work looks good.  His testosterone is at 953, his PSA is 0.5 and your hemoglobin and hematocrit are normal as well at 15.9 and 47.5 respectfully.  We will see him on 09/19.

## 2022-04-30 ENCOUNTER — Ambulatory Visit
Admission: RE | Admit: 2022-04-30 | Discharge: 2022-04-30 | Disposition: A | Discharge status: Home / Self Care | Payer: 59 | Source: Ambulatory Visit | Attending: Family Medicine | Admitting: Family Medicine

## 2022-04-30 DIAGNOSIS — R1013 Epigastric pain: Secondary | ICD-10-CM | POA: Diagnosis present

## 2022-04-30 DIAGNOSIS — R109 Unspecified abdominal pain: Secondary | ICD-10-CM | POA: Diagnosis not present

## 2022-04-30 MED ORDER — IOHEXOL 300 MG/ML  SOLN
100.0000 mL | Freq: Once | INTRAMUSCULAR | Status: AC | PRN
  Administered 2022-04-30: 100 mL via INTRAVENOUS

## 2022-05-06 ENCOUNTER — Telehealth: Payer: Self-pay | Admitting: Family Medicine

## 2022-05-06 DIAGNOSIS — R1013 Epigastric pain: Secondary | ICD-10-CM

## 2022-05-06 NOTE — Telephone Encounter (Signed)
Patient called and said that [facility] told him he had a stomach ulcer. He wants to know why he is not on a antibiotic. Patient was ask to schedule an appointment to come into the office, but patient stated; "why? I have be in the office five times".

## 2022-05-07 NOTE — Telephone Encounter (Signed)
Patient came into the office about his "ulcer", I explained to the patient that according to his CT scan he does not have a ulcer and he needs an upper endoscopy and that would be a referral to GI, he understood and stated that he did not want the endoscopy if he is put to sleep I informed him to call [facility] and ask them and if they did not put him to sleep I would have the provider put in a referral.

## 2022-05-07 NOTE — Telephone Encounter (Signed)
Is it anything to take for a stomach ulcer?  Merridy Pascoe,cma

## 2022-05-07 NOTE — Telephone Encounter (Signed)
Patient needs a referral to GI placed.  [facility].  Jasiya Markie,cma

## 2022-05-07 NOTE — Telephone Encounter (Signed)
Patient needs a referral to GI placed.  [facility].  Saba Neuman,cma

## 2022-05-07 NOTE — Telephone Encounter (Signed)
She did not test for H pylori so there is no indication for an antibiotic for his symptoms at this time. He has been on omeprazole so we can not do a h pylori breath or stool test unless he comes off of this for 2 weeks. I would need to have this test done and for it to be positive before I would place him on medication for an infection. Sharyn Lull previously referred him to GI though it looks like the patient cancelled that appointment. I think at this point he needs to see GI for evaluation as he likely needs to have an upper endoscopy to evaluated for a possible ulcer. I can refer him if he is ok with that.

## 2022-05-08 NOTE — Telephone Encounter (Signed)
Referral placed.

## 2022-05-08 NOTE — Addendum Note (Signed)
Addended by: Leone Haven on: 05/08/2022 10:13 AM   Modules accepted: Orders

## 2022-05-14 ENCOUNTER — Telehealth: Payer: Self-pay | Admitting: Urology

## 2022-05-14 NOTE — Telephone Encounter (Signed)
Pt called and was wondering why he needs an appt every month.  Please advise. [phone].

## 2022-05-15 NOTE — Telephone Encounter (Signed)
Pt medication is working.  Can't afford to come in every 30 days.  Please call [phone].

## 2022-05-16 NOTE — Telephone Encounter (Signed)
Attempted to reach pt, unable to leave a message. Pt can cancel Sept appt but needs to be scheduled for Nov. Labs and office visit per [facility].

## 2022-05-21 ENCOUNTER — Telehealth: Payer: Self-pay

## 2022-05-21 DIAGNOSIS — Z1211 Encounter for screening for malignant neoplasm of colon: Secondary | ICD-10-CM

## 2022-05-21 DIAGNOSIS — K219 Gastro-esophageal reflux disease without esophagitis: Secondary | ICD-10-CM

## 2022-05-21 DIAGNOSIS — R1013 Epigastric pain: Secondary | ICD-10-CM

## 2022-05-21 NOTE — Telephone Encounter (Signed)
He needs the GI consult and is due for a screening colonoscopy. I have never had to place two separate referrals for this, though I will go ahead and do so.

## 2022-05-21 NOTE — Telephone Encounter (Signed)
I received a fax from [facility] and they need clarification on the colonoscopy ordered.  The referral stated GERD, epigastric discomfort/colonoscopy.  If you are referring for colonoscopy only, or if for a colonoscopy, GI consult, these need to be separate referrals.  Colonoscopy needs an appropriate diagnosis, not GERD.  I put this fax in the review basket for you to review if need be.  Lucius Wise,cma

## 2022-05-28 ENCOUNTER — Ambulatory Visit: Payer: 59 | Admitting: Urology

## 2022-07-10 NOTE — Progress Notes (Unsigned)
07/11/22 2:17 PM   Mark Parsons. 27-Jul-1966 [id number]  Referring provider:  Leone Haven, MD [address] [place],   [address]  Urological history  1. Testosterone deficiency -contributing factors of age, hyperglycemia, obesity, chronic pain medication and alcohol abuse. -testosterone level pending -hemoglobin/hematocrit pending -managed with testosterone topical gel 1.62%, 3 pumps   2. Erythrocytosis -managed with periodic phlebotomy   3. BPH with LU TS -PSA (04/2022) 0.8 -cysto, 02/2022 -  Mild lateral lobe enlargement  prostate-nonocclusive -TRUS, 02/2022 - 28 grams  -contrast CT (04/2022) NED -I PSS 12/3 -PVR 0 mL    4. ED -contributory factors of age, BPH, testosterone deficiency, Peyronie's disease, alcohol abuse, smoking, antidepressants and pain medication  -SHIM 9 -tadalafil 20 mg, on-demand-dosing   5. Peyronie's disease -underwent interferon injections ~ 10 years ago    HPI: Mark Parsons. is a 56 y.o.male who returns today 6 month follow up   He is now voiding every three hours at night and three hours during the day.  Patient denies any modifying or aggravating factors.  Patient denies any gross hematuria, dysuria or suprapubic/flank pain.  Patient denies any fevers, chills, nausea or vomiting.      IPSS     Row Name 07/11/22 1300         International Prostate Symptom Score   How often have you had the sensation of not emptying your bladder? About half the time     How often have you had to urinate less than every two hours? About half the time     How often have you found you stopped and started again several times when you urinated? Not at All     How often have you found it difficult to postpone urination? About half the time     How often have you had a weak urinary stream? Not at All     How often have you had to strain to start urination? Not at All     How many times did you typically get up at night to  urinate? 3 Times     Total IPSS Score 12       Quality of Life due to urinary symptoms   If you were to spend the rest of your life with your urinary condition just the way it is now how would you feel about that? Mixed               Score:  1-7 Mild 8-19 Moderate 20-35 Severe   Patient still having spontaneous erections.  He denies any pain or curvature with erections.  He is not in a relationship right now.     SHIM     Row Name 07/11/22 1355         SHIM: Over the last 6 months:   How do you rate your confidence that you could get and keep an erection? Moderate     When you had erections with sexual stimulation, how often were your erections hard enough for penetration (entering your partner)? Sometimes (about half the time)     During sexual intercourse, how often were you able to maintain your erection after you had penetrated (entered) your partner? Almost Never or Never     During sexual intercourse, how difficult was it to maintain your erection to completion of intercourse? Extremely Difficult     When you attempted sexual intercourse, how often was it satisfactory for you? Almost Never or Never  SHIM Total Score   SHIM 9              Score: 1-7 Severe ED 8-11 Moderate ED 12-16 Mild-Moderate ED 17-21 Mild ED 22-25 No ED     PMH: Past Medical History:  Diagnosis Date   Depression    Hypertension    Low testosterone in male     Surgical History: No past surgical history on file.  Home Medications:  Allergies as of 07/11/2022       Reactions   Propofol Anaphylaxis   Near death        Medication List        Accurate as of July 11, 2022  2:17 PM. If you have any questions, ask your nurse or doctor.          STOP taking these medications    mirabegron ER 50 MG Tb24 tablet Commonly known as: MYRBETRIQ   omeprazole 40 MG capsule Commonly known as: PRILOSEC   tamsulosin 0.4 MG Caps capsule Commonly known as: FLOMAX        TAKE these medications    FLUoxetine 40 MG capsule Commonly known as: PROZAC Take 1 capsule by mouth once daily   lisinopril-hydrochlorothiazide 20-25 MG tablet Commonly known as: ZESTORETIC Take 1 tablet by mouth once daily   rosuvastatin 20 MG tablet Commonly known as: Crestor Take 1 tablet (20 mg total) by mouth daily.   tadalafil 20 MG tablet Commonly known as: CIALIS Take 1 tablet (20 mg total) by mouth daily as needed for erectile dysfunction.   Testosterone 1.62 % Gel APPLY 3 PUMP TOPICALLY DAILY        Allergies:  Allergies  Allergen Reactions   Propofol Anaphylaxis    Near death    Family History: Family History  Problem Relation Age of Onset   Colon cancer Mother    Multiple myeloma Father    Aortic aneurysm Father    Stroke Father     Social History:  reports that he has never smoked. He has never used smokeless tobacco. He reports current alcohol use of about 40.0 standard drinks of alcohol per week. He reports that he does not use drugs.   Physical Exam: BP (!) 152/84   Pulse 74   Ht _0  (1.778 m)   Wt 215 lb (97.5 kg)   BMI 30.85 kg/m   Constitutional:  Well nourished. Alert and oriented, No acute distress. HEENT: [place] AT, moist mucus membranes.  Trachea midline Cardiovascular: No clubbing, cyanosis, or edema. Respiratory: Normal respiratory effort, no increased work of breathing. Neurologic: Grossly intact, no focal deficits, moving all 4 extremities. Psychiatric: Normal mood and affect.   Laboratory Data: Pending  Pertinent Imaging: CLINICAL DATA:  Epigastric pain.   EXAM: CT ABDOMEN AND PELVIS WITH CONTRAST   TECHNIQUE: Multidetector CT imaging of the abdomen and pelvis was performed using the standard protocol following bolus administration of intravenous contrast.   RADIATION DOSE REDUCTION: This exam was performed according to the departmental dose-optimization program which includes automated exposure control,  adjustment of the mA and/or kV according to patient size and/or use of iterative reconstruction technique.   CONTRAST:  177m OMNIPAQUE IOHEXOL 300 MG/ML  SOLN   COMPARISON:  None Available.   FINDINGS: Lower chest: The visualized lung bases are clear.   No intra-abdominal free air or free fluid.   Hepatobiliary: Fatty liver. No biliary dilatation. The gallbladder is unremarkable.   Pancreas: Unremarkable. No pancreatic ductal dilatation or surrounding inflammatory changes.  Spleen: Normal in size without focal abnormality.   Adrenals/Urinary Tract: The adrenal glands are unremarkable. The kidneys, visualized ureters, and urinary bladder appear unremarkable.   Stomach/Bowel: There is no bowel obstruction or active inflammation. The appendix is normal.   Vascular/Lymphatic: The abdominal aorta and IVC are unremarkable. No portal venous gas. There is no adenopathy.   Reproductive: The prostate and seminal vesicles are grossly remarkable. No medius.   Other: Small fat containing umbilical hernia.   Musculoskeletal: No acute or significant osseous findings.   IMPRESSION: 1. No acute intra-abdominal or pelvic pathology. 2. Fatty liver.     Electronically Signed   By: Anner Crete M.D.   On: 05/01/2022 22:47    07/11/22 13:54  Scan Result 29m  I have independently reviewed the films.  See HPI.     Assessment & Plan:    1. Testosterone deficiency -testosterone and H & H pending   2. BPH with LUTS -PSA stable -cysto negative for BOO -discontinue the tamsulson  -Myrbetriq was not effective   3. ED -tadalafil 20 mg, on-demand-dosing   Return in about 6 months (around 01/09/2023) for IPSS, SHIM and exam, PSA, testosterone, H & HVenda Rodes PA-C  [facility] [address] [place] [phone]

## 2022-07-11 ENCOUNTER — Encounter: Payer: Self-pay | Admitting: Urology

## 2022-07-11 ENCOUNTER — Ambulatory Visit: Payer: 59 | Admitting: Urology

## 2022-07-11 VITALS — BP 152/84 | HR 74 | Ht 70.0 in | Wt 215.0 lb

## 2022-07-11 DIAGNOSIS — E349 Endocrine disorder, unspecified: Secondary | ICD-10-CM

## 2022-07-11 DIAGNOSIS — E291 Testicular hypofunction: Secondary | ICD-10-CM | POA: Diagnosis not present

## 2022-07-11 DIAGNOSIS — N401 Enlarged prostate with lower urinary tract symptoms: Secondary | ICD-10-CM

## 2022-07-11 DIAGNOSIS — N5201 Erectile dysfunction due to arterial insufficiency: Secondary | ICD-10-CM

## 2022-07-11 DIAGNOSIS — N138 Other obstructive and reflux uropathy: Secondary | ICD-10-CM | POA: Diagnosis not present

## 2022-07-11 LAB — BLADDER SCAN AMB NON-IMAGING

## 2022-07-12 ENCOUNTER — Telehealth: Payer: Self-pay | Admitting: Family Medicine

## 2022-07-12 LAB — HEMOGLOBIN AND HEMATOCRIT, BLOOD
Hematocrit: 49.8 % (ref 37.5–51.0)
Hemoglobin: 16.6 g/dL (ref 13.0–17.7)

## 2022-07-12 LAB — TESTOSTERONE: Testosterone: 265 ng/dL (ref 264–916)

## 2022-07-12 NOTE — Telephone Encounter (Signed)
-----   Message from Nori Riis, PA-C sent at 07/12/2022  8:44 AM EDT ----- Please let Mr. Glaze know that his labs were normal.  We will see him in April for repeat labs.

## 2022-07-12 NOTE — Telephone Encounter (Signed)
Unable to leave voicemail.

## 2022-07-16 NOTE — Telephone Encounter (Signed)
2nd attempt unable to leave message.

## 2022-07-18 NOTE — Telephone Encounter (Signed)
Patient notified and appointment made

## 2022-08-12 ENCOUNTER — Other Ambulatory Visit: Payer: Self-pay | Admitting: Family Medicine

## 2022-08-12 DIAGNOSIS — I10 Essential (primary) hypertension: Secondary | ICD-10-CM

## 2022-10-09 ENCOUNTER — Ambulatory Visit: Payer: 59 | Admitting: Family Medicine

## 2022-10-14 ENCOUNTER — Ambulatory Visit: Payer: 59 | Admitting: Family Medicine

## 2022-10-29 ENCOUNTER — Other Ambulatory Visit: Payer: Self-pay

## 2022-10-29 ENCOUNTER — Encounter: Payer: Self-pay | Admitting: Family Medicine

## 2022-10-29 ENCOUNTER — Ambulatory Visit (INDEPENDENT_AMBULATORY_CARE_PROVIDER_SITE_OTHER): Payer: 59 | Admitting: Family Medicine

## 2022-10-29 VITALS — BP 122/80 | HR 66 | Temp 98.1°F | Ht 70.0 in | Wt 215.8 lb

## 2022-10-29 DIAGNOSIS — F419 Anxiety disorder, unspecified: Secondary | ICD-10-CM | POA: Diagnosis not present

## 2022-10-29 DIAGNOSIS — I10 Essential (primary) hypertension: Secondary | ICD-10-CM

## 2022-10-29 DIAGNOSIS — E785 Hyperlipidemia, unspecified: Secondary | ICD-10-CM

## 2022-10-29 DIAGNOSIS — Z1211 Encounter for screening for malignant neoplasm of colon: Secondary | ICD-10-CM

## 2022-10-29 DIAGNOSIS — R1013 Epigastric pain: Secondary | ICD-10-CM | POA: Diagnosis not present

## 2022-10-29 DIAGNOSIS — F32A Depression, unspecified: Secondary | ICD-10-CM

## 2022-10-29 LAB — LIPID PANEL
Cholesterol: 162 mg/dL (ref 0–200)
HDL: 49.2 mg/dL (ref >39.00)
LDL Cholesterol: 87 mg/dL (ref 0–99)
NonHDL: 112.36
Total CHOL/HDL Ratio: 3
Triglycerides: 128 mg/dL (ref 0.0–149.0)
VLDL: 25.6 mg/dL (ref 0.0–40.0)

## 2022-10-29 LAB — COMPREHENSIVE METABOLIC PANEL
ALT: 26 U/L (ref 0–53)
AST: 18 U/L (ref 0–37)
Albumin: 4.2 g/dL (ref 3.5–5.2)
Alkaline Phosphatase: 53 U/L (ref 39–117)
BUN: 15 mg/dL (ref 6–23)
CO2: 27 mEq/L (ref 19–32)
Calcium: 9.1 mg/dL (ref 8.4–10.5)
Chloride: 101 mEq/L (ref 96–112)
Creatinine, Ser: 0.81 mg/dL (ref 0.40–1.50)
GFR: 99.23 mL/min (ref >60.00)
Glucose, Bld: 109 mg/dL — ABNORMAL HIGH (ref 70–99)
Potassium: 3.9 mEq/L (ref 3.5–5.1)
Sodium: 136 mEq/L (ref 135–145)
Total Bilirubin: 0.5 mg/dL (ref 0.2–1.2)
Total Protein: 7.2 g/dL (ref 6.0–8.3)

## 2022-10-29 MED ORDER — LISINOPRIL-HYDROCHLOROTHIAZIDE 20-25 MG PO TABS: 1.0000 | ORAL_TABLET | Freq: Every day | ORAL | 3 refills | Status: DC

## 2022-10-29 MED ORDER — ROSUVASTATIN CALCIUM 20 MG PO TABS: 20.0000 mg | ORAL_TABLET | Freq: Every day | ORAL | 3 refills | Status: DC

## 2022-10-29 MED ORDER — FLUOXETINE HCL 40 MG PO CAPS: 40.0000 mg | ORAL_CAPSULE | Freq: Every day | ORAL | 3 refills | Status: DC

## 2022-10-29 NOTE — Assessment & Plan Note (Signed)
Chronic issue.  Patient will continue Prozac 40 mg daily.  I did discuss the potential for other medications though he declines this at this time.

## 2022-10-29 NOTE — Assessment & Plan Note (Signed)
Chronic issue.  Continue Crestor 20 mg daily.  Check labs today.

## 2022-10-29 NOTE — Patient Instructions (Signed)
Nice to see you. We will check an H. pylori breath test today and contact you with the results when we get this back. GI from [facility] should contact you to schedule an evaluation for your stomach pain.  They should also call you separately to set up your colonoscopy.

## 2022-10-29 NOTE — Assessment & Plan Note (Signed)
Chronic issue.  We will refer back to GI at [facility].  We will get an H. pylori breath test today.  Discussed that I would only treat with antibiotics if the H. pylori breath test was positive.

## 2022-10-29 NOTE — Assessment & Plan Note (Signed)
Blood pressure is very near goal.  He will continue lisinopril-HCTZ 20-25 mg daily.  We will see what his blood pressure is at his next visit.

## 2022-10-29 NOTE — Progress Notes (Signed)
Mark Rumps, MD Phone: [phone]  Mark Parsons [place]. is a 57 y.o. male who presents today for f/u.  HYPERLIPIDEMIA Symptoms Chest pain on exertion:  no   Medications: Compliance- taking crestor Right upper quadrant pain- no  Muscle aches- no Lipid Panel     Component Value Date/Time   CHOL 212 (H) 11/20/2021 0942   TRIG 350.0 (H) 11/20/2021 0942   HDL 39.20 11/20/2021 0942   CHOLHDL 5 11/20/2021 0942   VLDL 70.0 (H) 11/20/2021 0942   LDLCALC  05/05/2020 1625     Comment:     . LDL cholesterol not calculated. Triglyceride levels greater than 400 mg/dL invalidate calculated LDL results. . Reference range: <100 . Desirable range <100 mg/dL for primary prevention;   <70 mg/dL for patients with CHD or diabetic patients  with > or = 2 CHD risk factors. Marland Kitchen LDL-C is now calculated using the Martin-Hopkins  calculation, which is a validated novel method providing  better accuracy than the Friedewald equation in the  estimation of LDL-C.  Mark Parsons et al. Mark Parsons. [id number]): 2061-2068  (http://education.[employer].com/faq/FAQ164)    LDLDIRECT 92.0 01/14/2022 1344   Epigastric pain: Patient continues to have issues with this.  He is not on a PPI.  He notes he talked to GI to schedule an appointment though the appointment was so far out that he did not schedule.  He has had a negative CT scan for cause.  He notes a lot of people have been advising him that he needs to be on a antibiotic for this issue.  Depression/anxiety: Patient notes a lot of this stems from caring for his elderly father and dealing with his own stomach issues.  He feels that the Prozac has been beneficial.  She notes no SI.  He does sleep a lot.  Social History   Tobacco Use  Smoking Status Never  Smokeless Tobacco Never    Current Outpatient Medications on File Prior to Visit  Medication Sig Dispense Refill   FLUoxetine (PROZAC) 40 MG capsule Take 1 capsule by mouth once daily 90 capsule 1    lisinopril-hydrochlorothiazide (ZESTORETIC) 20-25 MG tablet Take 1 tablet by mouth once daily 90 tablet 0   rosuvastatin (CRESTOR) 20 MG tablet Take 1 tablet (20 mg total) by mouth daily. 90 tablet 3   tadalafil (CIALIS) 20 MG tablet Take 1 tablet (20 mg total) by mouth daily as needed for erectile dysfunction. 30 tablet 3   Testosterone 1.62 % GEL APPLY 3 PUMP TOPICALLY DAILY 300 g 0   No current facility-administered medications on file prior to visit.     ROS see history of present illness  Objective  Physical Exam Vitals:   10/29/22 1101 10/29/22 1116  BP: 120/84 122/80  Pulse: 66   Temp: 98.1 F (36.7 C)   SpO2: 98%     BP Readings from Last 3 Encounters:  10/29/22 122/80  07/11/22 (!) 152/84  04/25/22 (!) 142/78   Wt Readings from Last 3 Encounters:  10/29/22 215 lb 12.8 oz (97.9 kg)  07/11/22 215 lb (97.5 kg)  04/25/22 210 lb (95.3 kg)    Physical Exam Constitutional:      General: He is not in acute distress.    Appearance: He is not diaphoretic.  Cardiovascular:     Rate and Rhythm: Normal rate and regular rhythm.     Heart sounds: Normal heart sounds.  Pulmonary:     Effort: Pulmonary effort is normal.     Breath sounds: Normal  breath sounds.  Skin:    General: Skin is warm and dry.  Neurological:     Mental Status: He is alert.      Assessment/Plan: Please see individual problem list.  Benign essential hypertension Assessment & Plan: Blood pressure is very near goal.  He will continue lisinopril-HCTZ 20-25 mg daily.  We will see what his blood pressure is at his next visit.   Hyperlipidemia, unspecified hyperlipidemia type Assessment & Plan: Chronic issue.  Continue Crestor 20 mg daily.  Check labs today.  Orders: -     Lipid panel -     Comprehensive metabolic panel  Epigastric abdominal pain Assessment & Plan: Chronic issue.  We will refer back to GI at [facility].  We will get an H. pylori breath test today.  Discussed that I would only  treat with antibiotics if the H. pylori breath test was positive.  Orders: -     Ambulatory referral to Gastroenterology -     H. pylori breath test  Anxiety and depression Assessment & Plan: Chronic issue.  Patient will continue Prozac 40 mg daily.  I did discuss the potential for other medications though he declines this at this time.   Colon cancer screening -     Ambulatory referral to Gastroenterology    Return in about 6 months (around 04/29/2023).   Mark Rumps, MD [place]

## 2022-11-04 LAB — H. PYLORI BREATH TEST: H. pylori Breath Test: NOT DETECTED

## 2022-11-06 ENCOUNTER — Telehealth: Payer: Self-pay | Admitting: Family Medicine

## 2022-11-06 NOTE — Telephone Encounter (Signed)
Patient came into office. He would like to change the referral from a colonoscopy to a Endoscopy.

## 2022-11-07 NOTE — Telephone Encounter (Signed)
Speak with Dr. Caryl Bis for the Patient regarding this message

## 2022-12-05 ENCOUNTER — Telehealth: Payer: Self-pay | Admitting: Family Medicine

## 2022-12-05 NOTE — Telephone Encounter (Signed)
Called Patient and he was not home so I will call back on MOnday

## 2022-12-05 NOTE — Telephone Encounter (Signed)
Pt called stating the provider was supposed to put in an endoscopy referral at [facility] and he was checking on it.

## 2022-12-11 NOTE — Telephone Encounter (Signed)
Called Patient the line is busy

## 2022-12-11 NOTE — Telephone Encounter (Signed)
Pt walked in today asking for information on a referral to [facility]. I contacted [facility] CMA. She's provided me with the info pt needed. Pt its aware of provider name and phone number he needs to call.

## 2022-12-24 ENCOUNTER — Other Ambulatory Visit: Payer: Self-pay | Admitting: Physician Assistant

## 2022-12-24 DIAGNOSIS — E349 Endocrine disorder, unspecified: Secondary | ICD-10-CM

## 2022-12-25 ENCOUNTER — Telehealth: Payer: Self-pay | Admitting: Urology

## 2022-12-25 NOTE — Telephone Encounter (Signed)
Pt stopped by office stating he is out of Testosterone.  He has an upcoming appt in May.  He said he left message yesterday and didn't hear anything.

## 2023-01-06 ENCOUNTER — Other Ambulatory Visit: Payer: 59

## 2023-01-09 ENCOUNTER — Ambulatory Visit: Payer: 59 | Admitting: Urology

## 2023-01-10 ENCOUNTER — Other Ambulatory Visit: Payer: 59

## 2023-01-10 DIAGNOSIS — E349 Endocrine disorder, unspecified: Secondary | ICD-10-CM

## 2023-01-10 DIAGNOSIS — N401 Enlarged prostate with lower urinary tract symptoms: Secondary | ICD-10-CM | POA: Diagnosis not present

## 2023-01-10 DIAGNOSIS — N138 Other obstructive and reflux uropathy: Secondary | ICD-10-CM

## 2023-01-11 LAB — HEMOGLOBIN AND HEMATOCRIT, BLOOD
Hematocrit: 47.8 % (ref 37.5–51.0)
Hemoglobin: 16.5 g/dL (ref 13.0–17.7)

## 2023-01-11 LAB — PSA: Prostate Specific Ag, Serum: 0.5 ng/mL (ref 0.0–4.0)

## 2023-01-11 LAB — TESTOSTERONE: Testosterone: 1195 ng/dL — ABNORMAL HIGH (ref 264–916)

## 2023-01-16 NOTE — Progress Notes (Unsigned)
01/19/23 8:49 PM   Mark Parsons 30-Mar-1966 [id number]  Referring provider:  Glori Luis, MD [address] [place],  [place] [address]  Urological history  1. Testosterone deficiency -contributing factors of age, hyperglycemia, obesity, chronic pain medication and alcohol abuse. -testosterone level  (01/2023) 1195 -hemoglobin/hematocrit (01/2023) 16.5/47.8 -managed with testosterone topical gel 1.62%, 3 pumps   2. Erythrocytosis -managed with periodic phlebotomy   3. BPH with LU TS -PSA (01/2023) 0.5 -cysto, 02/2022 -  Mild lateral lobe enlargement  prostate-nonocclusive -TRUS, 02/2022 - 28 grams  -contrast CT (04/2022) NED   4. ED -contributory factors of age, BPH, testosterone deficiency, Peyronie's disease, alcohol abuse, smoking, antidepressants and pain medication  -tadalafil 20 mg, on-demand-dosing   5. Peyronie's disease -underwent interferon injections ~ 10 years ago    HPI: Mark Tia. is a 57 y.o.male who returns today 6 month follow up.  He states his urinary frequency has returned.  He is also having nocturia x 3.  Patient denies any modifying or aggravating factors.  Patient denies any gross hematuria, dysuria or suprapubic/flank pain.  Patient denies any fevers, chills, nausea or vomiting.    I PSS 14/3   IPSS     Row Name 01/17/23 1100         International Prostate Symptom Score   How often have you had the sensation of not emptying your bladder? More than half the time     How often have you had to urinate less than every two hours? About half the time     How often have you found you stopped and started again several times when you urinated? Less than 1 in 5 times     How often have you found it difficult to postpone urination? Less than 1 in 5 times     How often have you had a weak urinary stream? Less than 1 in 5 times     How often have you had to strain to start urination? Less than 1 in 5 times     How many  times did you typically get up at night to urinate? 3 Times     Total IPSS Score 14       Quality of Life due to urinary symptoms   If you were to spend the rest of your life with your urinary condition just the way it is now how would you feel about that? Mixed               Score:  1-7 Mild 8-19 Moderate 20-35 Severe   SHIM 20  Patient still having spontaneous erections.  He denies any pain or curvature with erections.  Taking tadalafil 20 mg, on-demand-dosing.     SHIM     Row Name 01/17/23 1200         SHIM: Over the last 6 months:   How do you rate your confidence that you could get and keep an erection? High     When you had erections with sexual stimulation, how often were your erections hard enough for penetration (entering your partner)? Most Times (much more than half the time)     During sexual intercourse, how often were you able to maintain your erection after you had penetrated (entered) your partner? Most Times (much more than half the time)     During sexual intercourse, how difficult was it to maintain your erection to completion of intercourse? Slightly Difficult     When  you attempted sexual intercourse, how often was it satisfactory for you? Most Times (much more than half the time)       SHIM Total Score   SHIM 20              Score: 1-7 Severe ED 8-11 Moderate ED 12-16 Mild-Moderate ED 17-21 Mild ED 22-25 No ED     PMH: Past Medical History:  Diagnosis Date   Depression    Hypertension    Low testosterone in male     Surgical History: History reviewed. No pertinent surgical history.  Home Medications:  Allergies as of 01/17/2023       Reactions   Propofol Anaphylaxis   Near death        Medication List        Accurate as of Jan 17, 2023 11:59 PM. If you have any questions, ask your nurse or doctor.          FLUoxetine 40 MG capsule Commonly known as: PROZAC Take 1 capsule (40 mg total) by mouth daily.    lisinopril-hydrochlorothiazide 20-25 MG tablet Commonly known as: ZESTORETIC Take 1 tablet by mouth daily.   rosuvastatin 20 MG tablet Commonly known as: Crestor Take 1 tablet (20 mg total) by mouth daily.   tadalafil 20 MG tablet Commonly known as: CIALIS Take 1 tablet (20 mg total) by mouth daily as needed for erectile dysfunction.   Testosterone 1.62 % Gel APPLY 3 PUMPS TOPICALLY DAILY        Allergies:  Allergies  Allergen Reactions   Propofol Anaphylaxis    Near death    Family History: Family History  Problem Relation Age of Onset   Colon cancer Mother    Multiple myeloma Father    Aortic aneurysm Father    Stroke Father     Social History:  reports that he has never smoked. He has never used smokeless tobacco. He reports current alcohol use of about 40.0 standard drinks of alcohol per week. He reports that he does not use drugs.   Physical Exam: BP (!) 144/102   Pulse (!) 101   Ht 5\' 10"  (1.778 m)   Wt 210 lb (95.3 kg)   BMI 30.13 kg/m   Constitutional:  Well nourished. Alert and oriented, No acute distress. HEENT: Mark Pars[place] AT, moist mucus membranes.  Trachea midline Cardiovascular: No clubbing, cyanosis, or edema. Respiratory: Normal respiratory effort, no increased work of breathing. Neurologic: Grossly intact, no focal deficits, moving all 4 extremities. Psychiatric: Normal mood and affect.   Laboratory Data: Results for orders placed or performed in visit on 01/10/23  Testosterone  Result Value Ref Range   Testosterone 1,195 (H) 264 - 916 ng/dL  PSA  Result Value Ref Range   Prostate Specific Ag, Serum 0.5 0.0 - 4.0 ng/mL  Hemoglobin and hematocrit, blood  Result Value Ref Range   Hemoglobin 16.5 13.0 - 17.7 g/dL   Hematocrit 16.1 09.6 - 51.0 %  I have reviewed the labs.   Pertinent Imaging: N/A     Assessment & Plan:    1. Testosterone deficiency -testosterone level is super therapeutic -H & H WNL -he will apply 2 pumps on Sunday,  Tuesday, Thursday and Saturday, and 3 pumps on M, W, F  2. BPH with LUTS -PSA stable -cysto negative for BOO -he does not want any further work up or treatment   3. ED -tadalafil 20 mg, on-demand-dosing   Return in about 6 months (around 07/20/2023) for PSA, testosterone, H &  H, IPSS, SHIM and exam.  Mark Cowboy, PA-C  [facility] [address] [place], [place] [address] [phone]

## 2023-01-17 ENCOUNTER — Encounter: Payer: Self-pay | Admitting: Urology

## 2023-01-17 ENCOUNTER — Other Ambulatory Visit: Payer: Self-pay

## 2023-01-17 ENCOUNTER — Telehealth: Payer: Self-pay

## 2023-01-17 ENCOUNTER — Ambulatory Visit: Payer: 59 | Admitting: Urology

## 2023-01-17 VITALS — BP 144/102 | HR 101 | Ht 70.0 in | Wt 210.0 lb

## 2023-01-17 DIAGNOSIS — N529 Male erectile dysfunction, unspecified: Secondary | ICD-10-CM

## 2023-01-17 DIAGNOSIS — N132 Hydronephrosis with renal and ureteral calculous obstruction: Secondary | ICD-10-CM

## 2023-01-17 DIAGNOSIS — N138 Other obstructive and reflux uropathy: Secondary | ICD-10-CM

## 2023-01-17 DIAGNOSIS — N401 Enlarged prostate with lower urinary tract symptoms: Secondary | ICD-10-CM

## 2023-01-17 DIAGNOSIS — E291 Testicular hypofunction: Secondary | ICD-10-CM | POA: Diagnosis not present

## 2023-01-31 NOTE — Telephone Encounter (Signed)
Pt. left clinic without getting AVS with his follow up appts. for labs  and clinic. Left message on cell phone for patient to call clinic. Called home phone but unable to leave a message. AVS mailed to patient

## 2023-02-18 ENCOUNTER — Telehealth: Payer: Self-pay | Admitting: Family Medicine

## 2023-02-18 NOTE — Telephone Encounter (Signed)
Pt came into the office stating he need an endo referral instead of a colonoscopy referral. Pt want it to be sent to [facility] [phone]

## 2023-02-20 NOTE — Telephone Encounter (Signed)
Spoke to Patient to let him know an Endoscopy and Colonoscopy was ordered so he needs to discuss with GI but Epic does not show a Colonoscopy since 5 years ago so it is time for him to get that done.

## 2023-02-25 ENCOUNTER — Other Ambulatory Visit: Payer: 59

## 2023-03-04 ENCOUNTER — Ambulatory Visit: Payer: 59 | Admitting: Urology

## 2023-05-02 ENCOUNTER — Ambulatory Visit (INDEPENDENT_AMBULATORY_CARE_PROVIDER_SITE_OTHER): Payer: 59 | Admitting: Family Medicine

## 2023-05-02 ENCOUNTER — Encounter: Payer: Self-pay | Admitting: Family Medicine

## 2023-05-02 VITALS — BP 150/88 | HR 53 | Ht 69.5 in | Wt 211.8 lb

## 2023-05-02 DIAGNOSIS — F419 Anxiety disorder, unspecified: Secondary | ICD-10-CM | POA: Diagnosis not present

## 2023-05-02 DIAGNOSIS — M546 Pain in thoracic spine: Secondary | ICD-10-CM

## 2023-05-02 DIAGNOSIS — E669 Obesity, unspecified: Secondary | ICD-10-CM | POA: Diagnosis not present

## 2023-05-02 DIAGNOSIS — F32A Depression, unspecified: Secondary | ICD-10-CM | POA: Diagnosis not present

## 2023-05-02 DIAGNOSIS — I10 Essential (primary) hypertension: Secondary | ICD-10-CM

## 2023-05-02 DIAGNOSIS — G8929 Other chronic pain: Secondary | ICD-10-CM

## 2023-05-02 NOTE — Assessment & Plan Note (Signed)
Chronic issue.  Elevated today.  Blood pressure is adequately controlled at home.  Given adequate control at home we will continue lisinopril-HCTZ 1 tablet daily.  Check lab work today.

## 2023-05-02 NOTE — Progress Notes (Signed)
Mark Alar, MD Phone: [phone]  [name] is a 57 y.o. male who presents today for f/u.  Hypertension: Typically 125-130 systolic.  Taking lisinopril/HCTZ.  No chest pain, shortness of breath, or edema.  Anxiety/depression: Patient notes this is generally stable.  His dad's health issues contribute to his anxiety and depression.  He feels as though the Prozac has been helpful.  No SI.  Chronic back pain: Patient notes mid back pain has been going on for some time.  No radiation.  He has degenerative changes on prior thoracic spine film.  No numbness or weakness.  No incontinence.  Occasionally he will take Motrin with some benefit.  Obesity: Patient notes his diet is not great.  He eats fast food.  He does not get much activity.  Social History   Tobacco Use  Smoking Status Never  Smokeless Tobacco Never    Current Outpatient Medications on File Prior to Visit  Medication Sig Dispense Refill   FLUoxetine (PROZAC) 40 MG capsule Take 1 capsule (40 mg total) by mouth daily. 90 capsule 3   lisinopril-hydrochlorothiazide (ZESTORETIC) 20-25 MG tablet Take 1 tablet by mouth daily. 90 tablet 3   rosuvastatin (CRESTOR) 20 MG tablet Take 1 tablet (20 mg total) by mouth daily. 90 tablet 3   tadalafil (CIALIS) 20 MG tablet Take 1 tablet (20 mg total) by mouth daily as needed for erectile dysfunction. 30 tablet 3   Testosterone 1.62 % GEL APPLY 3 PUMPS TOPICALLY DAILY 300 g 0   No current facility-administered medications on file prior to visit.     ROS see history of present illness  Objective  Physical Exam Vitals:   05/02/23 1437 05/02/23 1440  BP: (!) 156/92 (!) 150/88  Pulse: (!) 53   SpO2: 97%     BP Readings from Last 3 Encounters:  05/02/23 (!) 150/88  01/17/23 (!) 144/102  10/29/22 122/80   Wt Readings from Last 3 Encounters:  05/02/23 211 lb 12.8 oz (96.1 kg)  01/17/23 210 lb (95.3 kg)  10/29/22 215 lb 12.8 oz (97.9 kg)    Physical  Exam Constitutional:      General: He is not in acute distress.    Appearance: He is not diaphoretic.  Cardiovascular:     Rate and Rhythm: Normal rate and regular rhythm.     Heart sounds: Normal heart sounds.  Pulmonary:     Effort: Pulmonary effort is normal.     Breath sounds: Normal breath sounds.  Skin:    General: Skin is warm and dry.  Neurological:     Mental Status: He is alert.      Assessment/Plan: Please see individual problem list.  Anxiety and depression Assessment & Plan: Chronic issue.  Still has some symptoms.  Patient wants to stay on Prozac 40 mg daily.  He will monitor.   Chronic midline thoracic back pain Assessment & Plan: Chronic issue.  Likely related to degenerative changes in his back.  Discussed PT though patient declines.  He will be given exercises to complete at home.   Obesity (BMI 30.0-34.9) Assessment & Plan: Chronic issue.  Discussed eating out less.  Advised to add in some exercise.   Benign essential hypertension Assessment & Plan: Chronic issue.  Elevated today.  Blood pressure is adequately controlled at home.  Given adequate control at home we will continue lisinopril-HCTZ 1 tablet daily.  Check lab work today.  Orders: -     Basic metabolic panel    Return  in about 3 months (around 08/02/2023) for Back pain.   Mark Alar, MD [facility]

## 2023-05-02 NOTE — Assessment & Plan Note (Signed)
Chronic issue.  Likely related to degenerative changes in his back.  Discussed PT though patient declines.  He will be given exercises to complete at home.

## 2023-05-02 NOTE — Assessment & Plan Note (Signed)
Chronic issue.  Still has some symptoms.  Patient wants to stay on Prozac 40 mg daily.  He will monitor.

## 2023-05-02 NOTE — Patient Instructions (Signed)
Back Exercises The following exercises strengthen the muscles that help to support the trunk (torso) and back. They also help to keep the lower back flexible. Doing these exercises can help to prevent or lessen existing low back pain. If you have back pain or discomfort, try doing these exercises 2-3 times each day or as told by your health care provider. As your pain improves, do them once each day, but increase the number of times that you repeat the steps for each exercise (do more repetitions). To prevent the recurrence of back pain, continue to do these exercises once each day or as told by your health care provider. Do exercises exactly as told by your health care provider and adjust them as directed. It is normal to feel mild stretching, pulling, tightness, or discomfort as you do these exercises, but you should stop right away if you feel sudden pain or your pain gets worse. Exercises Single knee to chest Repeat these steps 3-5 times for each leg: Lie on your back on a firm bed or the floor with your legs extended. Bring one knee to your chest. Your other leg should stay extended and in contact with the floor. Hold your knee in place by grabbing your knee or thigh with both hands and hold. Pull on your knee until you feel a gentle stretch in your lower back or buttocks. Hold the stretch for 10-30 seconds. Slowly release and straighten your leg.  Pelvic tilt Repeat these steps 5-10 times: Lie on your back on a firm bed or the floor with your legs extended. Bend your knees so they are pointing toward the ceiling and your feet are flat on the floor. Tighten your lower abdominal muscles to press your lower back against the floor. This motion will tilt your pelvis so your tailbone points up toward the ceiling instead of pointing to your feet or the floor. With gentle tension and even breathing, hold this position for 5-10 seconds.  Cat-cow Repeat these steps until your lower back becomes  more flexible: Get into a hands-and-knees position on a firm bed or the floor. Keep your hands under your shoulders, and keep your knees under your hips. You may place padding under your knees for comfort. Let your head hang down toward your chest. Contract your abdominal muscles and point your tailbone toward the floor so your lower back becomes rounded like the back of a cat. Hold this position for 5 seconds. Slowly lift your head, let your abdominal muscles relax, and point your tailbone up toward the ceiling so your back forms a sagging arch like the back of a cow. Hold this position for 5 seconds.  Press-ups Repeat these steps 5-10 times: Lie on your abdomen (face-down) on a firm bed or the floor. Place your palms near your head, about shoulder-width apart. Keeping your back as relaxed as possible and keeping your hips on the floor, slowly straighten your arms to raise the top half of your body and lift your shoulders. Do not use your back muscles to raise your upper torso. You may adjust the placement of your hands to make yourself more comfortable. Hold this position for 5 seconds while you keep your back relaxed. Slowly return to lying flat on the floor.  Bridges Repeat these steps 10 times: Lie on your back on a firm bed or the floor. Bend your knees so they are pointing toward the ceiling and your feet are flat on the floor. Your arms should be flat  at your sides, next to your body. Tighten your buttocks muscles and lift your buttocks off the floor until your waist is at almost the same height as your knees. You should feel the muscles working in your buttocks and the back of your thighs. If you do not feel these muscles, slide your feet 1-2 inches (2.5-5 cm) farther away from your buttocks. Hold this position for 3-5 seconds. Slowly lower your hips to the starting position, and allow your buttocks muscles to relax completely. If this exercise is too easy, try doing it with your arms  crossed over your chest. Back lifts Repeat these steps 5-10 times: Lie on your abdomen (face-down) with your arms at your sides, and rest your forehead on the floor. Tighten the muscles in your legs and your buttocks. Slowly lift your chest off the floor while you keep your hips pressed to the floor. Keep the back of your head in line with the curve in your back. Your eyes should be looking at the floor. Hold this position for 3-5 seconds. Slowly return to your starting position.  Contact a health care provider if: Your back pain or discomfort gets much worse when you do an exercise. Your worsening back pain or discomfort does not lessen within 2 hours after you exercise. If you have any of these problems, stop doing these exercises right away. Do not do them again unless your health care provider says that you can. Get help right away if: You develop sudden, severe back pain. If this happens, stop doing the exercises right away. Do not do them again unless your health care provider says that you can. This information is not intended to replace advice given to you by your health care provider. Make sure you discuss any questions you have with your health care provider. Document Revised: 09/29/2022 Document Reviewed: 11/08/2020 Elsevier Patient Education  2024 [employer].

## 2023-05-02 NOTE — Assessment & Plan Note (Signed)
Chronic issue.  Discussed eating out less.  Advised to add in some exercise.

## 2023-05-03 LAB — BASIC METABOLIC PANEL
BUN: 18 mg/dL (ref 7–25)
CO2: 24 mmol/L (ref 20–32)
Calcium: 9.6 mg/dL (ref 8.6–10.3)
Chloride: 101 mmol/L (ref 98–110)
Creat: 0.99 mg/dL (ref 0.70–1.30)
Glucose, Bld: 78 mg/dL (ref 65–99)
Potassium: 3.6 mmol/L (ref 3.5–5.3)
Sodium: 139 mmol/L (ref 135–146)

## 2023-05-06 ENCOUNTER — Telehealth: Payer: Self-pay

## 2023-05-06 NOTE — Telephone Encounter (Signed)
Patient just called and I read the message to him.

## 2023-05-06 NOTE — Telephone Encounter (Signed)
Noted  

## 2023-05-06 NOTE — Telephone Encounter (Signed)
-----   Message from Marikay Alar sent at 05/06/2023  9:24 AM EDT ----- Please let the patient know his lab work is acceptable.  Thanks.

## 2023-05-06 NOTE — Telephone Encounter (Signed)
Left message to call the office back regarding lab results below. 

## 2023-06-03 ENCOUNTER — Other Ambulatory Visit: Payer: Self-pay

## 2023-06-03 ENCOUNTER — Telehealth: Payer: Self-pay | Admitting: Family Medicine

## 2023-06-03 DIAGNOSIS — I10 Essential (primary) hypertension: Secondary | ICD-10-CM

## 2023-06-03 MED ORDER — LISINOPRIL-HYDROCHLOROTHIAZIDE 20-25 MG PO TABS: 1.0000 | ORAL_TABLET | Freq: Every day | ORAL | 3 refills | Status: DC

## 2023-06-03 NOTE — Telephone Encounter (Signed)
Prescription Request  06/03/2023  LOV: 05/02/2023  What is the name of the medication or equipment? lisinopril-hydrochlorothiazide (ZESTORETIC) 20-25 MG tablet   Have you contacted your pharmacy to request a refill? No   Which pharmacy would you like this sent to?   [facility] [address] [place] - [address] [place] [address] Phone: [phone] Fax: [phone]    Patient notified that their request is being sent to the clinical staff for review and that they should receive a response within 2 business days.   Please advise at Mobile [phone] (mobile)

## 2023-06-03 NOTE — Telephone Encounter (Signed)
Prescription sent to Pharmacy.

## 2023-08-04 ENCOUNTER — Ambulatory Visit: Payer: 59 | Admitting: Family Medicine

## 2023-08-26 ENCOUNTER — Other Ambulatory Visit: Payer: Self-pay | Admitting: Urology

## 2023-08-26 DIAGNOSIS — E349 Endocrine disorder, unspecified: Secondary | ICD-10-CM

## 2023-08-28 ENCOUNTER — Other Ambulatory Visit: Payer: Self-pay | Admitting: Urology

## 2023-08-28 DIAGNOSIS — E349 Endocrine disorder, unspecified: Secondary | ICD-10-CM

## 2023-08-29 ENCOUNTER — Telehealth: Payer: Self-pay | Admitting: Family Medicine

## 2023-08-29 ENCOUNTER — Telehealth: Payer: Self-pay | Admitting: Urology

## 2023-08-29 NOTE — Telephone Encounter (Signed)
He needs Testosterone refilled at [facility] on [address]. Says if he needs an appt. He can be called at [phone]. He will f/u after the Holidays to see what needs to be done unless he gets a call from someone before hand.

## 2023-08-29 NOTE — Telephone Encounter (Signed)
Left detailed message on VM that he needs to be seen, pt missed 2 appts , last OV was 01/2023

## 2023-09-04 ENCOUNTER — Other Ambulatory Visit: Payer: Self-pay

## 2023-09-04 DIAGNOSIS — E349 Endocrine disorder, unspecified: Secondary | ICD-10-CM

## 2023-09-04 MED ORDER — TESTOSTERONE 1.62 % TD GEL
TRANSDERMAL | 0 refills | Status: DC
Start: 2023-09-04 — End: 2023-09-19

## 2023-09-04 NOTE — Telephone Encounter (Signed)
Pt presents to front desk asking to speak to leader-   Pt states he came into the office on Friday 12/20 and requested a refill of T gel. He went to the pharmacy the next day and the rx was not there. He states this is a problem that he has to come here to get a refill.  Carollee Herter is great but the front desk can never get him what he needs.   He states that he thinks he may need an appt but not sure. He cannot call because we never answer the phone.  He states this a problem that he has discussed with Carollee Herter many times.   After reviewing his chart - front desk send message to CMA regarding this rx on 12/20. CMA called pt  on 12/20 and advised he needed an appt. Informed pt of this and he states he did not get a message. He wanted to know why they did not tell him at his last appt that he needed a follow up. Informed pt that he left before RN could print AVS.  RN called pt and advised him of his appts. She also mailed him his AVS. Pt states he did not get a call.   Confirmed pts phone number and address. Both correct in sys. Encouraged my chart- pt declines. Advised that I will make a note that if he does not answer we will sent him a letter. I also informed him that standard protocol is that he be seen q 6 months while on T. He should always have  6 month lab and appt if he continues on T.  Pt voiced understanding.   Appt made for 1/10 at 1030 with SM. Advised pt that I will send T get to provider in house [facility] - to see if we could give him enough until he is seen on 1/10.   Pt advised I will follow with him regarding rx. It may be tomorrow.  Pt voiced understanding.

## 2023-09-05 ENCOUNTER — Telehealth: Payer: Self-pay

## 2023-09-05 NOTE — Telephone Encounter (Signed)
Called pt an LM -   T rxed on 12/26. Reminded of appt on 1/10.  Also sent reminder letter to pts home and copy of rx with e rxed status. Highlighted note on rx that states no further refills until seen.

## 2023-09-17 NOTE — Progress Notes (Signed)
 09/19/23 11:14 AM   Lamar LITTIE Boyce Teddie 1966/07/18 [id number]  Referring provider:  Maribeth Camellia MATSU, MD [address] [place],  [place] [address]  Urological history  1. Testosterone  deficiency -contributing factors of age, hyperglycemia, obesity, chronic pain medication and alcohol abuse. -testosterone  level  pending -hemoglobin/hematocrit  pending -managed with testosterone  topical gel 1.62%, 3 pumps   2. Erythrocytosis -managed with periodic phlebotomy   3. BPH with LU TS -PSA pending  -cysto, 02/2022 -  Mild lateral lobe enlargement  prostate-nonocclusive -TRUS, 02/2022 - 28 grams  -contrast CT (04/2022) NED   4. ED -contributory factors of age, BPH, testosterone  deficiency, Peyronie's disease, alcohol abuse, smoking, antidepressants and pain medication  -tadalafil  20 mg, on-demand-dosing   5. Peyronie's disease -underwent interferon injections ~ 10 years ago    Chief Complaint  Patient presents with   Follow-up    follow-up    HPI: Rushton Early. is a 58 y.o.male who returns today 6 month follow up.  Previous records reviewed.     I PSS 9/2  No urological complaints.  Patient denies any modifying or aggravating factors.  Patient denies any recent UTI's, gross hematuria, dysuria or suprapubic/flank pain.  Patient denies any fevers, chills, nausea or vomiting.     IPSS     Row Name 09/19/23 1000         International Prostate Symptom Score   How often have you had the sensation of not emptying your bladder? Less than 1 in 5     How often have you had to urinate less than every two hours? About half the time     How often have you found you stopped and started again several times when you urinated? Not at All     How often have you found it difficult to postpone urination? Less than half the time     How often have you had a weak urinary stream? Not at All     How often have you had to strain to start urination? Not at All     How  many times did you typically get up at night to urinate? 3 Times     Total IPSS Score 9       Quality of Life due to urinary symptoms   If you were to spend the rest of your life with your urinary condition just the way it is now how would you feel about that? Mostly Satisfied                Score:  1-7 Mild 8-19 Moderate 20-35 Severe   SHIM 19  He is having satisfactory erections with the Cialis  20 mg on demand dosing.  He still has a curve in his penis, but he does not fear with intercourse.    SHIM     Row Name 09/19/23 1053         SHIM: Over the last 6 months:   How do you rate your confidence that you could get and keep an erection? High     When you had erections with sexual stimulation, how often were your erections hard enough for penetration (entering your partner)? Most Times (much more than half the time)     During sexual intercourse, how often were you able to maintain your erection after you had penetrated (entered) your partner? Sometimes (about half the time)     During sexual intercourse, how difficult was it to maintain your erection  to completion of intercourse? Slightly Difficult     When you attempted sexual intercourse, how often was it satisfactory for you? Most Times (much more than half the time)       SHIM Total Score   SHIM 19               Score: 1-7 Severe ED 8-11 Moderate ED 12-16 Mild-Moderate ED 17-21 Mild ED 22-25 No ED     PMH: Past Medical History:  Diagnosis Date   Depression    Hypertension    Low testosterone  in male     Surgical History: No past surgical history on file.  Home Medications:  Allergies as of 09/19/2023       Reactions   Propofol Anaphylaxis   Near death        Medication List        Accurate as of September 19, 2023 11:14 AM. If you have any questions, ask your nurse or doctor.          FLUoxetine  40 MG capsule Commonly known as: PROZAC  Take 1 capsule (40 mg total) by mouth  daily.   lisinopril -hydrochlorothiazide  20-25 MG tablet Commonly known as: ZESTORETIC  Take 1 tablet by mouth daily.   rosuvastatin  20 MG tablet Commonly known as: Crestor  Take 1 tablet (20 mg total) by mouth daily.   tadalafil  20 MG tablet Commonly known as: CIALIS  Take 1 tablet (20 mg total) by mouth daily as needed for erectile dysfunction.   Testosterone  1.62 % Gel -he will apply 2 pumps on Sunday, Tuesday, Thursday and Saturday, and 3 pumps on M, W, F        Allergies:  Allergies  Allergen Reactions   Propofol Anaphylaxis    Near death    Family History: Family History  Problem Relation Age of Onset   Colon cancer Mother    Multiple myeloma Father    Aortic aneurysm Father    Stroke Father     Social History:  reports that he has never smoked. He has never been exposed to tobacco smoke. He has never used smokeless tobacco. He reports current alcohol use of about 40.0 standard drinks of alcohol per week. He reports that he does not use drugs.   Physical Exam: BP (!) 187/145   Pulse 81   Ht 5' 10 (1.778 m)   Wt 212 lb (96.2 kg)   BMI 30.42 kg/m   Constitutional:  Well nourished. Alert and oriented, No acute distress. HEENT: [place] AT, moist mucus membranes.  Trachea midline Cardiovascular: No clubbing, cyanosis, or edema. Respiratory: Normal respiratory effort, no increased work of breathing. Neurologic: Grossly intact, no focal deficits, moving all 4 extremities. Psychiatric: Normal mood and affect.   Laboratory Data: Results for orders placed or performed in visit on 05/02/23  Basic Metabolic Panel (BMET)   Collection Time: 05/02/23  2:44 PM  Result Value Ref Range   Glucose, Bld 78 65 - 99 mg/dL   BUN 18 7 - 25 mg/dL   Creat 9.00 9.29 - 8.69 mg/dL   BUN/Creatinine Ratio SEE NOTE: 6 - 22 (calc)   Sodium 139 135 - 146 mmol/L   Potassium 3.6 3.5 - 5.3 mmol/L   Chloride 101 98 - 110 mmol/L   CO2 24 20 - 32 mmol/L   Calcium  9.6 8.6 - 10.3 mg/dL  I have  reviewed the labs.   Pertinent Imaging: N/A     Assessment & Plan:    1. Testosterone  deficiency -testosterone  level pending  -  Hemoglobin/hematocrit level pending -he will apply 2 pumps on Sunday, Tuesday, Thursday and Saturday, and 3 pumps on M, W, F  2. BPH with LUTS -PSA pending  -cysto negative for BOO -he does not want any further work up or treatment   3. ED -tadalafil  20 mg, on-demand-dosing   Return in about 6 months (around 03/18/2024) for testosterone , hemoglobin, hematocrit, psa, I PSS, SHIM .  [name]   [facility] [address] [place], [place] [address] [phone]

## 2023-09-19 ENCOUNTER — Ambulatory Visit: Payer: 59 | Admitting: Urology

## 2023-09-19 ENCOUNTER — Encounter: Payer: Self-pay | Admitting: Urology

## 2023-09-19 VITALS — BP 187/145 | HR 81 | Ht 70.0 in | Wt 212.0 lb

## 2023-09-19 DIAGNOSIS — N138 Other obstructive and reflux uropathy: Secondary | ICD-10-CM | POA: Diagnosis not present

## 2023-09-19 DIAGNOSIS — N5201 Erectile dysfunction due to arterial insufficiency: Secondary | ICD-10-CM

## 2023-09-19 DIAGNOSIS — N401 Enlarged prostate with lower urinary tract symptoms: Secondary | ICD-10-CM

## 2023-09-19 DIAGNOSIS — N529 Male erectile dysfunction, unspecified: Secondary | ICD-10-CM

## 2023-09-19 DIAGNOSIS — E291 Testicular hypofunction: Secondary | ICD-10-CM | POA: Diagnosis not present

## 2023-09-19 DIAGNOSIS — E349 Endocrine disorder, unspecified: Secondary | ICD-10-CM

## 2023-09-19 MED ORDER — TESTOSTERONE 1.62 % TD GEL: TRANSDERMAL | 5 refills | Status: DC

## 2023-09-19 MED ORDER — TADALAFIL 20 MG PO TABS: 20.0000 mg | ORAL_TABLET | Freq: Every day | ORAL | 3 refills | Status: DC | PRN

## 2023-09-20 LAB — HEMOGLOBIN AND HEMATOCRIT, BLOOD
Hematocrit: 46.4 % (ref 37.5–51.0)
Hemoglobin: 15.9 g/dL (ref 13.0–17.7)

## 2023-09-20 LAB — PSA: Prostate Specific Ag, Serum: 0.5 ng/mL (ref 0.0–4.0)

## 2023-09-20 LAB — TESTOSTERONE: Testosterone: 199 ng/dL — ABNORMAL LOW (ref 264–916)

## 2023-09-24 NOTE — Telephone Encounter (Signed)
 error

## 2023-09-24 NOTE — Telephone Encounter (Signed)
 Error

## 2023-10-13 ENCOUNTER — Telehealth: Payer: Self-pay | Admitting: Family Medicine

## 2023-10-13 DIAGNOSIS — E785 Hyperlipidemia, unspecified: Secondary | ICD-10-CM

## 2023-10-13 NOTE — Telephone Encounter (Signed)
Prescription Request  10/13/2023  LOV: 05/02/2023 Paient is doing a TOC with Kacy on 10/21/2023  What is the name of the medication or equipment?  FLUoxetine (PROZAC) 40 MG capsule and  rosuvastatin (CRESTOR) 20 MG tablet    Have you contacted your pharmacy to request a refill? No   Which pharmacy would you like this sent to?  [facility] [address], [place] - [address] [place] [address] Phone: [phone] Fax: [phone]    Patient notified that their request is being sent to the clinical staff for review and that they should receive a response within 2 business days.   Please advise at [facility] [phone]

## 2023-10-14 MED ORDER — FLUOXETINE HCL 40 MG PO CAPS: 40.0000 mg | ORAL_CAPSULE | Freq: Every day | ORAL | 3 refills | Status: AC

## 2023-10-14 MED ORDER — ROSUVASTATIN CALCIUM 20 MG PO TABS: 20.0000 mg | ORAL_TABLET | Freq: Every day | ORAL | 3 refills | Status: AC

## 2023-10-14 NOTE — Telephone Encounter (Signed)
 Refills sent

## 2023-10-14 NOTE — Addendum Note (Signed)
Addended by: Donavan Foil on: 10/14/2023 10:40 AM   Modules accepted: Orders

## 2023-10-21 ENCOUNTER — Ambulatory Visit (INDEPENDENT_AMBULATORY_CARE_PROVIDER_SITE_OTHER): Payer: 59 | Admitting: Nurse Practitioner

## 2023-10-21 ENCOUNTER — Encounter: Payer: Self-pay | Admitting: Nurse Practitioner

## 2023-10-21 VITALS — BP 140/98 | HR 85 | Temp 98.3°F | Ht 70.0 in | Wt 221.8 lb

## 2023-10-21 DIAGNOSIS — E785 Hyperlipidemia, unspecified: Secondary | ICD-10-CM | POA: Diagnosis not present

## 2023-10-21 DIAGNOSIS — Z1329 Encounter for screening for other suspected endocrine disorder: Secondary | ICD-10-CM | POA: Diagnosis not present

## 2023-10-21 DIAGNOSIS — E291 Testicular hypofunction: Secondary | ICD-10-CM

## 2023-10-21 DIAGNOSIS — N486 Induration penis plastica: Secondary | ICD-10-CM

## 2023-10-21 DIAGNOSIS — R7303 Prediabetes: Secondary | ICD-10-CM

## 2023-10-21 DIAGNOSIS — F419 Anxiety disorder, unspecified: Secondary | ICD-10-CM | POA: Diagnosis not present

## 2023-10-21 DIAGNOSIS — I10 Essential (primary) hypertension: Secondary | ICD-10-CM | POA: Diagnosis not present

## 2023-10-21 DIAGNOSIS — R1013 Epigastric pain: Secondary | ICD-10-CM | POA: Diagnosis not present

## 2023-10-21 DIAGNOSIS — F32A Depression, unspecified: Secondary | ICD-10-CM

## 2023-10-21 NOTE — Assessment & Plan Note (Signed)
Ongoing depression and anxiety are likely exacerbated by life stressors, including caregiving for a father with terminal cancer. PHQ and GAD- 6 today. Symptoms are well-managed with Prozac 40 mg daily. Continue Prozac and encourage reaching out if symptoms worsen or additional support is needed.

## 2023-10-21 NOTE — Progress Notes (Signed)
Mark Dicker, NP-C Phone: [phone]  Mark Vassell. is a 58 y.o. male who presents today for transfer of care.   Discussed the use of AI scribe software for clinical note transcription with the patient, who gave verbal consent to proceed.  History of Present Illness   Mark Parsons. is a 58 year old male with hypertension and depression who presents for a transfer of care.   He has a long-standing history of hypertension, managed with lisinopril-hctz. He did not take his medication on the day of the visit but typically monitors his blood pressure at home, recording readings normally below 140/80 mmHg. No chest pain, shortness of breath, dizziness, or swelling.  He has a history of depression, managed with Prozac, which he describes as situational and related to his current life circumstances, including living with and caring for his 19 year old father who has terminal cancer, macular degeneration, and congestive heart failure. He feels his depression is well-managed with Prozac.  He reports a history of Peyronie's disease, for which he has received interferon injections. He is considering Xiaflex treatment but is hesitant due to potential risks. He describes his condition as manageable and prefers not to pursue aggressive treatment at this time.  He is on testosterone therapy, which he previously administered via injections but switched due to elevated hematocrit levels. He reports improvement with his current regimen. He also takes Cialis, which he uses infrequently due to not being in a relationship.  He takes Crestor for cholesterol management and denies any abdominal pain or muscle aches.  He has a history of an allergic reaction to propofol during a previous colonoscopy, which has made him hesitant to undergo further procedures requiring anesthesia.      Social History   Tobacco Use  Smoking Status Never   Passive exposure: Never  Smokeless Tobacco Never    Current  Outpatient Medications on File Prior to Visit  Medication Sig Dispense Refill   FLUoxetine (PROZAC) 40 MG capsule Take 1 capsule (40 mg total) by mouth daily. 90 capsule 3   lisinopril-hydrochlorothiazide (ZESTORETIC) 20-25 MG tablet Take 1 tablet by mouth daily. 90 tablet 3   rosuvastatin (CRESTOR) 20 MG tablet Take 1 tablet (20 mg total) by mouth daily. 90 tablet 3   tadalafil (CIALIS) 20 MG tablet Take 1 tablet (20 mg total) by mouth daily as needed for erectile dysfunction. 30 tablet 3   Testosterone 1.62 % GEL -he will apply 2 pumps on Sunday, Tuesday, Thursday and Saturday, and 3 pumps on M, W, F 75 g 5   No current facility-administered medications on file prior to visit.    ROS see history of present illness  Objective  Physical Exam Vitals:   10/21/23 1506 10/21/23 1541  BP: (!) 140/80 (!) 140/98  Pulse: 85   Temp: 98.3 F (36.8 C)   SpO2: 95%     BP Readings from Last 3 Encounters:  10/21/23 (!) 140/98  09/19/23 (!) 187/145  05/02/23 (!) 150/88   Wt Readings from Last 3 Encounters:  10/21/23 221 lb 12.8 oz (100.6 kg)  09/19/23 212 lb (96.2 kg)  05/02/23 211 lb 12.8 oz (96.1 kg)    Physical Exam Constitutional:      General: He is not in acute distress.    Appearance: Normal appearance.  HENT:     Head: Normocephalic.  Cardiovascular:     Rate and Rhythm: Normal rate and regular rhythm.     Heart sounds: Normal heart sounds.  Pulmonary:  Effort: Pulmonary effort is normal.     Breath sounds: Normal breath sounds.  Skin:    General: Skin is warm and dry.  Neurological:     General: No focal deficit present.     Mental Status: He is alert.  Psychiatric:        Mood and Affect: Mood normal.        Behavior: Behavior normal.    Assessment/Plan: Please see individual problem list.  Benign essential hypertension Assessment & Plan: Blood pressure is elevated today x 2, possibly due to missed medication, with home readings below 140/80. Continue  Lisinopril-hydrochlorothiazide 20-25 mg daily. Monitor blood pressure at home, aiming for readings closer to 120/80. Advised to contact if blood pressure is remaining consistently elevated.   Orders: -     CBC with Differential/Platelet -     Comprehensive metabolic panel  Anxiety and depression Assessment & Plan: Ongoing depression and anxiety are likely exacerbated by life stressors, including caregiving for a father with terminal cancer. PHQ and GAD- 6 today. Symptoms are well-managed with Prozac 40 mg daily. Continue Prozac and encourage reaching out if symptoms worsen or additional support is needed.   Epigastric abdominal pain Assessment & Plan: Due for a colonoscopy but concerned about a previous allergic reaction to propofol. Stomach has been doing better recently. Consider referral to Gastroenterology for further discussion and management of colon cancer screening and possible EGD for chronic abdominal pain. He will let me know if he is agreeable to the referral.    Peyronie's disease Assessment & Plan: Mild symptoms are currently managed by Urology. Hesitant to pursue more invasive treatments due to potential risks. Continue current management with Urology.   Hypogonadism in male Assessment & Plan: Reports improved testosterone levels with the current regimen. Continue current management with Urology.   Hyperlipidemia, unspecified hyperlipidemia type Assessment & Plan: Currently on Crestor 20 mg daily with no reported side effects. Continue Crestor. Check lipid panel today. Encouraged healthy diet and exercise.   Orders: -     Lipid panel  Prediabetes Assessment & Plan: Check A1c today. Encouraged healthy diet and exercise.   Orders: -     Hemoglobin A1c  Thyroid disorder screen -     TSH    Return in about 6 months (around 04/19/2024) for Follow up.   Mark Dicker, NP-C [facility]

## 2023-10-21 NOTE — Assessment & Plan Note (Signed)
Check A1c today. Encouraged healthy diet and exercise.

## 2023-10-21 NOTE — Assessment & Plan Note (Signed)
Due for a colonoscopy but concerned about a previous allergic reaction to propofol. Stomach has been doing better recently. Consider referral to Gastroenterology for further discussion and management of colon cancer screening and possible EGD for chronic abdominal pain. He will let me know if he is agreeable to the referral.

## 2023-10-21 NOTE — Assessment & Plan Note (Signed)
Currently on Crestor 20 mg daily with no reported side effects. Continue Crestor. Check lipid panel today. Encouraged healthy diet and exercise.

## 2023-10-21 NOTE — Assessment & Plan Note (Signed)
Blood pressure is elevated today x 2, possibly due to missed medication, with home readings below 140/80. Continue Lisinopril-hydrochlorothiazide 20-25 mg daily. Monitor blood pressure at home, aiming for readings closer to 120/80. Advised to contact if blood pressure is remaining consistently elevated.

## 2023-10-21 NOTE — Assessment & Plan Note (Signed)
Mild symptoms are currently managed by Urology. Hesitant to pursue more invasive treatments due to potential risks. Continue current management with Urology.

## 2023-10-21 NOTE — Assessment & Plan Note (Signed)
Reports improved testosterone levels with the current regimen. Continue current management with Urology.

## 2023-10-22 LAB — CBC WITH DIFFERENTIAL/PLATELET
Basophils Absolute: 0.1 10*3/uL (ref 0.0–0.1)
Basophils Relative: 1.5 % (ref 0.0–3.0)
Eosinophils Absolute: 0.1 10*3/uL (ref 0.0–0.7)
Eosinophils Relative: 1.9 % (ref 0.0–5.0)
HCT: 46.1 % (ref 39.0–52.0)
Hemoglobin: 15.5 g/dL (ref 13.0–17.0)
Lymphocytes Relative: 30.4 % (ref 12.0–46.0)
Lymphs Abs: 2 10*3/uL (ref 0.7–4.0)
MCHC: 33.5 g/dL (ref 30.0–36.0)
MCV: 96.4 fL (ref 78.0–100.0)
Monocytes Absolute: 0.7 10*3/uL (ref 0.1–1.0)
Monocytes Relative: 10.3 % (ref 3.0–12.0)
Neutro Abs: 3.7 10*3/uL (ref 1.4–7.7)
Neutrophils Relative %: 55.9 % (ref 43.0–77.0)
Platelets: 268 10*3/uL (ref 150.0–400.0)
RBC: 4.78 Mil/uL (ref 4.22–5.81)
RDW: 13.1 % (ref 11.5–15.5)
WBC: 6.6 10*3/uL (ref 4.0–10.5)

## 2023-10-22 LAB — COMPREHENSIVE METABOLIC PANEL
ALT: 28 U/L (ref 0–53)
AST: 22 U/L (ref 0–37)
Albumin: 4.5 g/dL (ref 3.5–5.2)
Alkaline Phosphatase: 53 U/L (ref 39–117)
BUN: 17 mg/dL (ref 6–23)
CO2: 28 meq/L (ref 19–32)
Calcium: 9.3 mg/dL (ref 8.4–10.5)
Chloride: 99 meq/L (ref 96–112)
Creatinine, Ser: 0.97 mg/dL (ref 0.40–1.50)
GFR: 87.26 mL/min (ref >60.00)
Glucose, Bld: 92 mg/dL (ref 70–99)
Potassium: 3.7 meq/L (ref 3.5–5.1)
Sodium: 138 meq/L (ref 135–145)
Total Bilirubin: 0.6 mg/dL (ref 0.2–1.2)
Total Protein: 7.8 g/dL (ref 6.0–8.3)

## 2023-10-22 LAB — LIPID PANEL
Cholesterol: 230 mg/dL — ABNORMAL HIGH (ref 0–200)
HDL: 53.6 mg/dL (ref >39.00)
LDL Cholesterol: 125 mg/dL — ABNORMAL HIGH (ref 0–99)
NonHDL: 175.9
Total CHOL/HDL Ratio: 4
Triglycerides: 255 mg/dL — ABNORMAL HIGH (ref 0.0–149.0)
VLDL: 51 mg/dL — ABNORMAL HIGH (ref 0.0–40.0)

## 2023-10-22 LAB — HEMOGLOBIN A1C: Hgb A1c MFr Bld: 5.9 % (ref 4.6–6.5)

## 2023-10-22 LAB — TSH: TSH: 3.53 u[IU]/mL (ref 0.35–5.50)

## 2023-10-24 ENCOUNTER — Telehealth: Payer: Self-pay

## 2023-10-24 NOTE — Telephone Encounter (Signed)
Copied from CRM [id number]. Topic: General - Call Back - No Documentation >> Oct 24, 2023 11:27 AM Florestine Avers wrote: Reason for CRM: Patient had a missed call and is requesting a call back.

## 2023-12-16 ENCOUNTER — Ambulatory Visit: Payer: Self-pay

## 2023-12-16 NOTE — Telephone Encounter (Signed)
 Patient called, left VM to return the call to the office to speak to NT.    Copied from CRM [id number]. Topic: Clinical - Medication Question >> Dec 16, 2023 11:18 AM Mark Parsons wrote: Reason for CRM: Patient has a rash that is producing some heat on his upper right thigh. He was wondering if there was a topical medication that can be sent in for him or if an appointment is needed.

## 2023-12-16 NOTE — Telephone Encounter (Signed)
VM left for pt to CB

## 2023-12-16 NOTE — Telephone Encounter (Signed)
  Chief Complaint: rash Symptoms:  Red, blisters, raised, itchy areas located under testicles: near upper left & upper right thigh Frequency: x couple months Pertinent Negatives: Patient denies fever  Disposition: [] ED /[] Urgent Care (no appt availability in office) / [] Appointment(In office/virtual)/ []  [facility]/ [] Home Care/ [x] Refused Recommended Disposition /[] [facility]/ []  Follow-up with PCP Additional Notes: has tried monkey butt cream to try to dry up area without relief.  Offered pt appt: pt stated he would just like to ask PCP to call something in for rash.  Pt states rash comes from heat: currently using calamine lotion on area.  Reason for Disposition  [1] Applying cream or ointment AND [2] causes severe itch, burning or pain    Itchy rash x couple of months  Answer Assessment - Initial Assessment Questions 1. APPEARANCE of RASH: "Describe the rash."      Red, blisters, raised 2. LOCATION: "Where is the rash located?"      Upper right and left thigh - under testicles area 3. NUMBER: "How many spots are there?"      several 4. SIZE: "How big are the spots?" (Inches, centimeters or compare to size of a coin)      unknown 5. ONSET: "When did the rash start?"      X couple months 6. ITCHING: "Does the rash itch?" If Yes, ask: "How bad is the itch?"  (Scale 0-10; or none, mild, moderate, severe)     moderate 7. PAIN: "Does the rash hurt?" If Yes, ask: "How bad is the pain?"  (Scale 0-10; or none, mild, moderate, severe)    - NONE (0): no pain    - MILD (1-3): doesn't interfere with normal activities     - MODERATE (4-7): interferes with normal activities or awakens from sleep     - SEVERE (8-10): excruciating pain, unable to do any normal activities     none 8. OTHER SYMPTOMS: "Do you have any other symptoms?" (e.g., fever)     no 9. PREGNANCY: "Is there any chance you are pregnant?" "When was your last menstrual period?"     N/a  Protocols used:  Rash or Redness - Localized-A-AH

## 2023-12-16 NOTE — Telephone Encounter (Signed)
   Copied from CRM [id number]. Topic: Clinical - Medication Question >> Dec 16, 2023 11:18 AM [place] J wrote: Reason for CRM: Patient has a rash that is producing some heat on his upper right thigh. He was wondering if there was a topical medication that can be sent in for him or if an appointment is needed.

## 2023-12-16 NOTE — Telephone Encounter (Signed)
 Copied from CRM [id number]. Topic: General - Other >> Dec 16, 2023  2:58 PM Almira Coaster wrote: Reason for CRM: Patient is returning a call he received from Donavan Foil, called [facility] to see if she was available, they informed me that she was not picking up and to send a CRM advising patient is returning her call.

## 2023-12-18 ENCOUNTER — Encounter: Payer: Self-pay | Admitting: Nurse Practitioner

## 2023-12-18 ENCOUNTER — Ambulatory Visit (INDEPENDENT_AMBULATORY_CARE_PROVIDER_SITE_OTHER): Admitting: Nurse Practitioner

## 2023-12-18 VITALS — BP 128/88 | HR 107 | Temp 98.6°F | Ht 70.0 in | Wt 210.2 lb

## 2023-12-18 DIAGNOSIS — B356 Tinea cruris: Secondary | ICD-10-CM | POA: Diagnosis not present

## 2023-12-18 DIAGNOSIS — E66811 Obesity, class 1: Secondary | ICD-10-CM

## 2023-12-18 MED ORDER — CLOTRIMAZOLE-BETAMETHASONE 1-0.05 % EX CREA: 1.0000 | TOPICAL_CREAM | Freq: Two times a day (BID) | CUTANEOUS | 0 refills | Status: DC

## 2023-12-18 NOTE — Assessment & Plan Note (Signed)
 He has a chronic rash on the right inner thigh, likely fungal and consistent with tinea cruris, with symptoms of itching and redness worsened by heat and moisture. Limited relief was noted from talc and cortisone cream. Prescribe antifungal cream twice daily for 2 weeks, then as needed once cleared. Advise keeping the area dry and avoiding scratching. Recommend over-the-counter antifungal powders or balms before outdoor activities.

## 2023-12-18 NOTE — Assessment & Plan Note (Signed)
 He lost 11 pounds over two and a half months, reducing weight from 221 to 210 pounds. The target weight is 185-190 pounds. Current strategies are effective. Encourage continuation of healthy diet and regular exercise.

## 2023-12-18 NOTE — Progress Notes (Signed)
 Bethanie Dicker, NP-C Phone: [phone]  Mark Parsons. is a 58 y.o. male who presents today for rash.   Discussed the use of AI scribe software for clinical note transcription with the patient, who gave verbal consent to proceed.  History of Present Illness   Mark Parsons. is a 58 year old male who presents with a persistent rash.  He has had a rash for approximately six to eight weeks, primarily located on the right side of the inner thigh. The rash is itchy and red but not painful. He has used 'monkey butt powder' and cortisone cream, which helped reduce the itch but did not resolve the rash. He associates the rash with sweating and heat, noting that he tends to sweat at night even with air conditioning.  He experiences night sweats, which he attributes to seasonal changes and possibly his testosterone use. He has a history of polycythemia, which is currently well-managed, and he takes testosterone, which he believes contributes to his sensitivity to heat.  He has lost 11 pounds over the past two and a half months, going from 221 to 210 pounds. He attributes this weight loss to lifestyle changes and wants to reach a weight of 185 to 190 pounds. He no longer plays basketball but engages in some physical activities like golf.  He lives with his 13 year old father, who has multiple myeloma and congestive heart failure, creating a stressful living situation. Despite this, he feels that Prozac has helped him manage stress.      Social History   Tobacco Use  Smoking Status Never   Passive exposure: Never  Smokeless Tobacco Never    Current Outpatient Medications on File Prior to Visit  Medication Sig Dispense Refill   FLUoxetine (PROZAC) 40 MG capsule Take 1 capsule (40 mg total) by mouth daily. 90 capsule 3   lisinopril-hydrochlorothiazide (ZESTORETIC) 20-25 MG tablet Take 1 tablet by mouth daily. 90 tablet 3   rosuvastatin (CRESTOR) 20 MG tablet Take 1 tablet (20 mg total) by  mouth daily. 90 tablet 3   tadalafil (CIALIS) 20 MG tablet Take 1 tablet (20 mg total) by mouth daily as needed for erectile dysfunction. 30 tablet 3   Testosterone 1.62 % GEL -he will apply 2 pumps on Sunday, Tuesday, Thursday and Saturday, and 3 pumps on M, W, F 75 g 5   No current facility-administered medications on file prior to visit.     ROS see history of present illness  Objective  Physical Exam Vitals:   12/18/23 1101  BP: 128/88  Pulse: (!) 107  Temp: 98.6 F (37 C)  SpO2: 95%    BP Readings from Last 3 Encounters:  12/18/23 128/88  10/21/23 (!) 140/98  09/19/23 (!) 187/145   Wt Readings from Last 3 Encounters:  12/18/23 210 lb 3.2 oz (95.3 kg)  10/21/23 221 lb 12.8 oz (100.6 kg)  09/19/23 212 lb (96.2 kg)    Physical Exam Exam conducted with a chaperone present Tresa Endo Fiscal, CMA).  Constitutional:      General: He is not in acute distress.    Appearance: Normal appearance.  HENT:     Head: Normocephalic.  Cardiovascular:     Rate and Rhythm: Normal rate and regular rhythm.     Heart sounds: Normal heart sounds.  Pulmonary:     Effort: Pulmonary effort is normal.     Breath sounds: Normal breath sounds.  Genitourinary:    Pubic Area: Rash present.  Comments: Right side inner thigh with erythema and inflammation present consistent with fungal infection.  Skin:    General: Skin is warm and dry.  Neurological:     General: No focal deficit present.     Mental Status: He is alert.  Psychiatric:        Mood and Affect: Mood normal.        Behavior: Behavior normal.     Assessment/Plan: Please see individual problem list.  Jock itch Assessment & Plan: He has a chronic rash on the right inner thigh, likely fungal and consistent with tinea cruris, with symptoms of itching and redness worsened by heat and moisture. Limited relief was noted from talc and cortisone cream. Prescribe antifungal cream twice daily for 2 weeks, then as needed once  cleared. Advise keeping the area dry and avoiding scratching. Recommend over-the-counter antifungal powders or balms before outdoor activities.  Orders: -     Clotrimazole-Betamethasone; Apply 1 Application topically 2 (two) times daily. X 2 weeks  Dispense: 60 g; Refill: 0  Obesity (BMI 30.0-34.9) Assessment & Plan: He lost 11 pounds over two and a half months, reducing weight from 221 to 210 pounds. The target weight is 185-190 pounds. Current strategies are effective. Encourage continuation of healthy diet and regular exercise.       Return if symptoms worsen or fail to improve.   Bethanie Dicker, NP-C [facility]

## 2024-03-17 NOTE — Progress Notes (Unsigned)
 03/18/24 2:43 PM   Mark Parsons. [birth year] [id number]  Referring provider:  Maribeth Camellia MATSU, MD [address] [place],  [place] [address]  Urological history  1.  Hypogonadism -contributing factors of age, hyperglycemia, obesity, chronic pain medication and alcohol abuse. -testosterone  level  pending -hemoglobin/hematocrit  pending -managed with testosterone  topical gel 1.62%, 3 pumps   2. Erythrocytosis -managed with periodic phlebotomy   3. BPH with LU TS -PSA pending  -cysto, 02/2022 -  Mild lateral lobe enlargement  prostate-nonocclusive -TRUS, 02/2022 - 28 grams  -contrast CT (04/2022) NED   4. ED -contributory factors of age, BPH, testosterone  deficiency, Peyronie's disease, alcohol abuse, smoking, antidepressants and pain medication  -tadalafil  20 mg, on-demand-dosing   5. Peyronie's disease -underwent interferon injections ~ 10 years ago    Chief Complaint  Patient presents with   Hypogonadism in male    HPI: Mark Parsons. is a 58 y.o.male who returns today 6 month follow up.  Previous records reviewed.     He is having some urinary frequency, but it is not bothersome to him.  Patient denies any modifying or aggravating factors.  Patient denies any recent UTI's, gross hematuria, dysuria or suprapubic/flank pain.  Patient denies any fevers, chills, nausea or vomiting.    He is having good erections with the tadalafil  20 mg on demand dosing.  He is not having any pain or curvature with erections.  Hbg A1c (10/2023) 5.9  Serum creatinine (10/2023) 0.97, eGFR 87.26   PMH: Past Medical History:  Diagnosis Date   Depression    Hypertension    Low testosterone  in male     Surgical History: No past surgical history on file.  Home Medications:  Allergies as of 03/18/2024       Reactions   Propofol Anaphylaxis   Near death        Medication List        Accurate as of March 18, 2024  2:43 PM. If you have any  questions, ask your nurse or doctor.          clotrimazole -betamethasone  cream Commonly known as: LOTRISONE  Apply 1 Application topically 2 (two) times daily. X 2 weeks   FLUoxetine  40 MG capsule Commonly known as: PROZAC  Take 1 capsule (40 mg total) by mouth daily.   lisinopril -hydrochlorothiazide  20-25 MG tablet Commonly known as: ZESTORETIC  Take 1 tablet by mouth daily.   rosuvastatin  20 MG tablet Commonly known as: Crestor  Take 1 tablet (20 mg total) by mouth daily.   tadalafil  20 MG tablet Commonly known as: CIALIS  Take 1 tablet (20 mg total) by mouth daily as needed for erectile dysfunction.   Testosterone  1.62 % Gel -he will apply 2 pumps on Sunday, Tuesday, Thursday and Saturday, and 3 pumps on M, W, F        Allergies:  Allergies  Allergen Reactions   Propofol Anaphylaxis    Near death    Family History: Family History  Problem Relation Age of Onset   Colon cancer Mother    Multiple myeloma Father    Aortic aneurysm Father    Stroke Father     Social History:  reports that he has never smoked. He has never been exposed to tobacco smoke. He has never used smokeless tobacco. He reports current alcohol use of about 40.0 standard drinks of alcohol per week. He reports that he does not use drugs.   Physical Exam: BP (!) 148/100   Pulse 76  Ht 5' 10 (1.778 m)   Wt 205 lb (93 kg)   BMI 29.41 kg/m   Constitutional:  Well nourished. Alert and oriented, No acute distress. HEENT: [place] AT, moist mucus membranes.  Trachea midline Cardiovascular: No clubbing, cyanosis, or edema. Respiratory: Normal respiratory effort, no increased work of breathing. GU: No CVA tenderness.  No bladder fullness or masses.  Patient with circumcised phallus.   Urethral meatus is patent.  No penile discharge. No penile lesions or rashes.  Large Peyronie's plaque located in the base of the penis and a smaller rectangular Peyronie's plaque located anteriorly mid shaft .  Neurologic:  Grossly intact, no focal deficits, moving all 4 extremities. Psychiatric: Normal mood and affect.   Laboratory Data: See EPIC and HPI I have reviewed the labs.   Pertinent Imaging: N/A   Assessment & Plan:    1. Hypogonadism  -testosterone  level pending  -Hemoglobin/hematocrit level pending -continue 2 pumps on Sunday, Tuesday, Thursday and Saturday, and 3 pumps on M, W, F  2. BPH with LUTS -PSA pending  -cysto negative for BOO -he does not want any further work up or treatment   3. ED -continue tadalafil  20 mg, on-demand-dosing   Return in about 6 months (around 09/18/2024) for Testosterone , hemoglobin, hematocrit, PSA and symptom recheck.  Mark Parsons   [facility] [address] [place], [place] [address] [phone]

## 2024-03-18 ENCOUNTER — Ambulatory Visit: Payer: Self-pay | Admitting: Urology

## 2024-03-18 ENCOUNTER — Encounter: Payer: Self-pay | Admitting: Urology

## 2024-03-18 VITALS — BP 148/100 | HR 76 | Ht 70.0 in | Wt 205.0 lb

## 2024-03-18 DIAGNOSIS — E291 Testicular hypofunction: Secondary | ICD-10-CM

## 2024-03-18 DIAGNOSIS — N138 Other obstructive and reflux uropathy: Secondary | ICD-10-CM | POA: Diagnosis not present

## 2024-03-18 DIAGNOSIS — E349 Endocrine disorder, unspecified: Secondary | ICD-10-CM

## 2024-03-18 DIAGNOSIS — N529 Male erectile dysfunction, unspecified: Secondary | ICD-10-CM | POA: Diagnosis not present

## 2024-03-18 DIAGNOSIS — N401 Enlarged prostate with lower urinary tract symptoms: Secondary | ICD-10-CM

## 2024-03-18 DIAGNOSIS — N5201 Erectile dysfunction due to arterial insufficiency: Secondary | ICD-10-CM

## 2024-03-18 MED ORDER — TESTOSTERONE 1.62 % TD GEL: TRANSDERMAL | 5 refills | Status: DC

## 2024-03-18 MED ORDER — TADALAFIL 20 MG PO TABS: 20.0000 mg | ORAL_TABLET | Freq: Every day | ORAL | 3 refills | Status: AC | PRN

## 2024-03-19 ENCOUNTER — Ambulatory Visit: Payer: Self-pay | Admitting: Urology

## 2024-03-19 LAB — PSA: Prostate Specific Ag, Serum: 0.7 ng/mL (ref 0.0–4.0)

## 2024-03-19 LAB — HEMOGLOBIN AND HEMATOCRIT, BLOOD
Hematocrit: 46.1 % (ref 37.5–51.0)
Hemoglobin: 15.4 g/dL (ref 13.0–17.7)

## 2024-03-19 LAB — TESTOSTERONE: Testosterone: 419 ng/dL (ref 264–916)

## 2024-03-22 ENCOUNTER — Telehealth: Payer: Self-pay

## 2024-03-22 NOTE — Telephone Encounter (Signed)
 Copied from CRM [id number]. Topic: Clinical - Medication Question >> Mar 22, 2024  1:16 PM Mark Parsons wrote: Reason for CRM: patient states he is still having a concern after using it  clotrimazole -betamethasone  (LOTRISONE ) cream And he would like to ask for a different prescription to clear up the concern  Pt num [phone] (M)

## 2024-03-23 ENCOUNTER — Telehealth: Payer: Self-pay | Admitting: Nurse Practitioner

## 2024-03-23 NOTE — Telephone Encounter (Unsigned)
 Copied from CRM [id number]. Topic: General - Other >> Mar 23, 2024  9:36 AM Franky GRADE wrote: Reason for CRM: Patient is returning a call he received from [place], [place]. Advised that we needed to schedule an appointment as he has already been seen this year for this concern.

## 2024-03-23 NOTE — Telephone Encounter (Signed)
 error

## 2024-03-23 NOTE — Telephone Encounter (Signed)
 Detailed vm left asking pt to CB to schedule an appt and apologized for the inconvenience however, he was seen 3 months ago on 12-18-23 for this matter the Provider herself stated he needs to be reevaluated.

## 2024-03-23 NOTE — Telephone Encounter (Addendum)
 Pt stopped in to discuss a need of med change. Pharmacy told pt he would need to be seen again, he is not wanting to be seen again unless absolutely necessary. He was quite irritated and stated maybe he would have to just find another physician if he needs to be seen because he's been seen once already and it would just be a money grab. Pt states the cream he was prescribed at last visit with Kacy (clotrimazole -betamethasone  (LOTRISONE ) cream) has not worked and he needs something different. Please reach out to pt. [facility]

## 2024-03-29 NOTE — Telephone Encounter (Addendum)
 Left message for patient to return my call. Patient returned call protocol followed.

## 2024-04-20 ENCOUNTER — Ambulatory Visit (INDEPENDENT_AMBULATORY_CARE_PROVIDER_SITE_OTHER): Payer: 59 | Admitting: Nurse Practitioner

## 2024-04-20 VITALS — BP 170/100 | HR 69 | Temp 98.4°F | Ht 70.0 in | Wt 207.8 lb

## 2024-04-20 DIAGNOSIS — I10 Essential (primary) hypertension: Secondary | ICD-10-CM

## 2024-04-20 DIAGNOSIS — F419 Anxiety disorder, unspecified: Secondary | ICD-10-CM

## 2024-04-20 DIAGNOSIS — F32A Depression, unspecified: Secondary | ICD-10-CM

## 2024-04-20 DIAGNOSIS — B356 Tinea cruris: Secondary | ICD-10-CM

## 2024-04-20 MED ORDER — AMLODIPINE BESYLATE 2.5 MG PO TABS: 2.5000 mg | ORAL_TABLET | Freq: Every day | ORAL | 0 refills | Status: DC

## 2024-04-20 MED ORDER — KETOCONAZOLE 2 % EX CREA: 1.0000 | TOPICAL_CREAM | Freq: Every day | CUTANEOUS | 0 refills | Status: DC

## 2024-04-20 NOTE — Progress Notes (Signed)
 Mark Glance, NP-C Phone: [phone]  Mark Parsons. is a 58 y.o. male who presents today for follow up.   Discussed the use of AI scribe software for clinical note transcription with the patient, who gave verbal consent to proceed.  History of Present Illness   Mark Parsons. is a 58 year old male with hypertension who presents with elevated blood pressure.  He has a long-standing history of hypertension for over twenty years, with fluctuating blood pressure readings. His blood pressure was 128 mmHg during his last visit, but it is elevated today despite taking his medications, lisinopril  and hydrochlorothiazide , about 20 minutes prior to the appointment. He sometimes checks his blood pressure at home using his father's cuff, and it is usually not too bad. He usually takes his blood pressure medication in the afternoon. He notes that his testosterone  therapy might be affecting his blood pressure.  He is currently on lisinopril , hydrochlorothiazide , Prozac , rosuvastatin , and testosterone  gel. He has been experiencing weight loss, having gone from 220 lbs to 207 lbs, primarily through dietary changes such as eating less, consuming more vegetables, and reducing processed foods. He acknowledges that his exercise routine is lacking, although he used to play basketball. He feels that his breathing has improved and he feels more coordinated.  He mentions a past head injury from a few years ago, resulting in a visible dent on his head. He had scans at the time and reports no cognitive issues since then. He expresses concern about head trauma due to his uncle's recent fall and his father's advanced age.  He reports a rash that initially improved with a cream used twice daily for two weeks, but it has not completely cleared. He experiences night sweats around his neck, which he attributes to the heat and possibly contributing to the rash.  He lives with his 35 year old father, who has multiple  health issues including heart failure and blindness. He is an only child and finds the caregiving responsibilities stressful. He is on Prozac , which he feels provides stability. No chest pain, shortness of breath, dizziness, swelling, headache, or vision changes.      Social History   Tobacco Use  Smoking Status Never   Passive exposure: Never  Smokeless Tobacco Never    Current Outpatient Medications on File Prior to Visit  Medication Sig Dispense Refill   clotrimazole -betamethasone  (LOTRISONE ) cream Apply 1 Application topically 2 (two) times daily. X 2 weeks 60 g 0   FLUoxetine  (PROZAC ) 40 MG capsule Take 1 capsule (40 mg total) by mouth daily. 90 capsule 3   lisinopril -hydrochlorothiazide  (ZESTORETIC ) 20-25 MG tablet Take 1 tablet by mouth daily. 90 tablet 3   rosuvastatin  (CRESTOR ) 20 MG tablet Take 1 tablet (20 mg total) by mouth daily. 90 tablet 3   tadalafil  (CIALIS ) 20 MG tablet Take 1 tablet (20 mg total) by mouth daily as needed for erectile dysfunction. 30 tablet 3   Testosterone  1.62 % GEL -he will apply 2 pumps on Sunday, Tuesday, Thursday and Saturday, and 3 pumps on M, W, F 75 g 5   No current facility-administered medications on file prior to visit.     ROS see history of present illness  Objective  Physical Exam Vitals:   04/20/24 1358 04/20/24 1406  BP: (!) 177/115 (!) 170/100  Pulse:    Temp:    SpO2:      BP Readings from Last 3 Encounters:  04/20/24 (!) 170/100  03/18/24 (!) 148/100  12/18/23 128/88  Wt Readings from Last 3 Encounters:  04/20/24 207 lb 12.8 oz (94.3 kg)  03/18/24 205 lb (93 kg)  12/18/23 210 lb 3.2 oz (95.3 kg)    Physical Exam Constitutional:      General: He is not in acute distress.    Appearance: Normal appearance.  HENT:     Head: Normocephalic.  Cardiovascular:     Rate and Rhythm: Normal rate and regular rhythm.     Heart sounds: Normal heart sounds.  Pulmonary:     Effort: Pulmonary effort is normal.     Breath  sounds: Normal breath sounds.  Skin:    General: Skin is warm and dry.  Neurological:     General: No focal deficit present.     Mental Status: He is alert.  Psychiatric:        Mood and Affect: Mood normal.        Behavior: Behavior normal.      Assessment/Plan: Please see individual problem list.  Benign essential hypertension Assessment & Plan: Blood pressure remains elevated despite lisinopril  - hydrochlorothiazide  20 - 25 mg daily. Possible causes include inconsistent medication timing and testosterone  use. Provide a blood pressure log for home monitoring. Check blood pressure daily for two weeks. Advise taking medication consistently in the morning. Start Amlodipine  2.5 mg daily. Continue Lisinopril  - hydrochlorothiazide . Schedule follow-up in two weeks to reassess.  Orders: -     amLODIPine  Besylate; Take 1 tablet (2.5 mg total) by mouth daily.  Dispense: 90 tablet; Refill: 0  Jock itch Assessment & Plan: Rash improved but not resolved. Previous treatment partially effective. Exacerbated by sweating and heat. Trial Ketoconazole  daily for 2-6 weeks. Advised to contact if not improving.   Orders: -     Ketoconazole ; Apply 1 Application topically daily. X 2-6 weeks.  Dispense: 60 g; Refill: 0  Anxiety and depression Assessment & Plan: Ongoing depression and anxiety are likely exacerbated by life stressors, including caregiving for a father with terminal cancer. Symptoms are well-managed with Prozac  40 mg daily. Continue Prozac  and encourage reaching out if symptoms worsen or additional support is needed.       Return in about 2 weeks (around 05/04/2024) for Follow up on blood pressure.   Mark Glance, NP-C [facility]

## 2024-05-03 ENCOUNTER — Encounter: Payer: Self-pay | Admitting: Nurse Practitioner

## 2024-05-03 NOTE — Assessment & Plan Note (Signed)
 Ongoing depression and anxiety are likely exacerbated by life stressors, including caregiving for a father with terminal cancer. Symptoms are well-managed with Prozac  40 mg daily. Continue Prozac  and encourage reaching out if symptoms worsen or additional support is needed.

## 2024-05-03 NOTE — Assessment & Plan Note (Signed)
 Blood pressure remains elevated despite lisinopril  - hydrochlorothiazide  20 - 25 mg daily. Possible causes include inconsistent medication timing and testosterone  use. Provide a blood pressure log for home monitoring. Check blood pressure daily for two weeks. Advise taking medication consistently in the morning. Start Amlodipine  2.5 mg daily. Continue Lisinopril  - hydrochlorothiazide . Schedule follow-up in two weeks to reassess.

## 2024-05-03 NOTE — Assessment & Plan Note (Signed)
 Rash improved but not resolved. Previous treatment partially effective. Exacerbated by sweating and heat. Trial Ketoconazole  daily for 2-6 weeks. Advised to contact if not improving.

## 2024-05-05 ENCOUNTER — Ambulatory Visit: Admitting: Nurse Practitioner

## 2024-06-09 ENCOUNTER — Encounter: Payer: Self-pay | Admitting: Nurse Practitioner

## 2024-06-09 ENCOUNTER — Ambulatory Visit (INDEPENDENT_AMBULATORY_CARE_PROVIDER_SITE_OTHER): Admitting: Nurse Practitioner

## 2024-06-09 ENCOUNTER — Telehealth: Payer: Self-pay

## 2024-06-09 VITALS — BP 140/98 | HR 74 | Temp 97.7°F | Ht 70.0 in | Wt 206.2 lb

## 2024-06-09 DIAGNOSIS — B356 Tinea cruris: Secondary | ICD-10-CM | POA: Diagnosis not present

## 2024-06-09 DIAGNOSIS — I10 Essential (primary) hypertension: Secondary | ICD-10-CM | POA: Diagnosis not present

## 2024-06-09 DIAGNOSIS — F419 Anxiety disorder, unspecified: Secondary | ICD-10-CM

## 2024-06-09 DIAGNOSIS — F32A Depression, unspecified: Secondary | ICD-10-CM | POA: Diagnosis not present

## 2024-06-09 DIAGNOSIS — R221 Localized swelling, mass and lump, neck: Secondary | ICD-10-CM | POA: Diagnosis not present

## 2024-06-09 MED ORDER — KETOCONAZOLE 2 % EX CREA: 1.0000 | TOPICAL_CREAM | Freq: Every day | CUTANEOUS | 2 refills | Status: AC

## 2024-06-09 MED ORDER — AMLODIPINE BESYLATE 5 MG PO TABS: 5.0000 mg | ORAL_TABLET | Freq: Every day | ORAL | 0 refills | Status: DC

## 2024-06-09 NOTE — Assessment & Plan Note (Signed)
 Hypertension remains uncontrolled despite medication. Stress may contribute to elevated levels. Increase amlodipine  to 5 mg daily. Instruct him to take two 2.5 mg tablets until the current supply is exhausted, then switch to 5 mg tablets. Continue lisinopril -hydrochlorothiazide  20-25 mg daily. Instruct him to check blood pressure 1-2 hours after taking medication. Schedule a follow-up in one month to monitor blood pressure.

## 2024-06-09 NOTE — Assessment & Plan Note (Signed)
 A mass has been present for two years, initially a pustule, now calcified. Differential includes cyst or solid mass. Order an ultrasound of the neck mass to determine its nature. Further work up pending.

## 2024-06-09 NOTE — Progress Notes (Signed)
 Leron Glance, NP-C Phone: [phone]  Mark Parsons. is a 58 y.o. male who presents today for follow up.   Discussed the use of AI scribe software for clinical note transcription with the patient, who gave verbal consent to proceed.  History of Present Illness   Mark Parsons. is a 58 year old male with hypertension who presents for follow-up on blood pressure management.  He is currently on amlodipine  2.5 mg, lisinopril , and hydrochlorothiazide  for hypertension. Initially, he did not take the new medication for the first two weeks, hoping to manage his blood pressure without it, but was unsuccessful. He has been monitoring his blood pressure, noting high readings without medication and improvement with medication. Recent readings with medication include 139/99, 146/95, and 145/97. Without medication, his readings were 154/110 and 155/104.  He reports significant life stressors, including his father's recent fall and health issues, as well as the deaths of a friend and a cousin. He is the primary caregiver for his father, who is 39 years old, blind, and has congestive heart failure and myeloma. The stress of caregiving and managing his father's health has been considerable. He is also taking Prozac , which he feels is effective in managing his mood despite the situational stressors.  He has a lesion on the right side of his neck that started as a zit about two years ago and has since calcified. He reports that the lesion is unsightly. He is concerned about its appearance and potential health implications.  No chest pain, shortness of breath, dizziness, or swelling.      Social History   Tobacco Use  Smoking Status Never   Passive exposure: Never  Smokeless Tobacco Never    Current Outpatient Medications on File Prior to Visit  Medication Sig Dispense Refill   clotrimazole -betamethasone  (LOTRISONE ) cream Apply 1 Application topically 2 (two) times daily. X 2 weeks 60 g 0    FLUoxetine  (PROZAC ) 40 MG capsule Take 1 capsule (40 mg total) by mouth daily. 90 capsule 3   lisinopril -hydrochlorothiazide  (ZESTORETIC ) 20-25 MG tablet Take 1 tablet by mouth daily. 90 tablet 3   rosuvastatin  (CRESTOR ) 20 MG tablet Take 1 tablet (20 mg total) by mouth daily. 90 tablet 3   tadalafil  (CIALIS ) 20 MG tablet Take 1 tablet (20 mg total) by mouth daily as needed for erectile dysfunction. 30 tablet 3   Testosterone  1.62 % GEL -he will apply 2 pumps on Sunday, Tuesday, Thursday and Saturday, and 3 pumps on M, W, F 75 g 5   No current facility-administered medications on file prior to visit.     ROS see history of present illness  Objective  Physical Exam Vitals:   06/09/24 1138 06/09/24 1202  BP: (!) 146/98 (!) 140/98  Pulse: 74   Temp: 97.7 F (36.5 C)   SpO2: 98%     BP Readings from Last 3 Encounters:  06/09/24 (!) 140/98  04/20/24 (!) 170/100  03/18/24 (!) 148/100   Wt Readings from Last 3 Encounters:  06/09/24 206 lb 3.2 oz (93.5 kg)  04/20/24 207 lb 12.8 oz (94.3 kg)  03/18/24 205 lb (93 kg)    Physical Exam Constitutional:      General: He is not in acute distress.    Appearance: Normal appearance.  HENT:     Head: Normocephalic.  Neck:      Comments: Small, hard, non-moveable mass noted.  Cardiovascular:     Rate and Rhythm: Normal rate and regular rhythm.  Heart sounds: Normal heart sounds.  Pulmonary:     Effort: Pulmonary effort is normal.     Breath sounds: Normal breath sounds.  Musculoskeletal:     Cervical back: Full passive range of motion without pain.  Skin:    General: Skin is warm and dry.  Neurological:     General: No focal deficit present.     Mental Status: He is alert.  Psychiatric:        Mood and Affect: Mood normal.        Behavior: Behavior normal.      Assessment/Plan: Please see individual problem list.  Benign essential hypertension Assessment & Plan: Hypertension remains uncontrolled despite medication.  Stress may contribute to elevated levels. Increase amlodipine  to 5 mg daily. Instruct him to take two 2.5 mg tablets until the current supply is exhausted, then switch to 5 mg tablets. Continue lisinopril -hydrochlorothiazide  20-25 mg daily. Instruct him to check blood pressure 1-2 hours after taking medication. Schedule a follow-up in one month to monitor blood pressure.  Orders: -     amLODIPine  Besylate; Take 1 tablet (5 mg total) by mouth daily.  Dispense: 90 tablet; Refill: 0  Localized swelling, mass and lump, neck Assessment & Plan: A mass has been present for two years, initially a pustule, now calcified. Differential includes cyst or solid mass. Order an ultrasound of the neck mass to determine its nature. Further work up pending.   Orders: -     US  SOFT TISSUE HEAD & NECK (NON-THYROID); Future  Anxiety and depression Assessment & Plan: Managed with Prozac , which is reported as effective. Stress from caregiving and family events is noted. Continue Prozac  and encourage reaching out if symptoms worsen or additional support is needed.   Jock itch Assessment & Plan: Effective. Requesting refills.   Orders: -     Ketoconazole ; Apply 1 Application topically daily. X 2-6 weeks.  Dispense: 60 g; Refill: 2     Return in about 4 weeks (around 07/07/2024) for Follow up.   Leron Glance, NP-C [facility]

## 2024-06-09 NOTE — Assessment & Plan Note (Signed)
 Managed with Prozac , which is reported as effective. Stress from caregiving and family events is noted. Continue Prozac  and encourage reaching out if symptoms worsen or additional support is needed.

## 2024-06-09 NOTE — Telephone Encounter (Signed)
 Patient saw Leron Glance, FNP-C, today and Leron would like to see him again in four weeks.  Leron does not have any available slots on her schedule.  Please let us  know how Leron would like for us  to schedule patient.

## 2024-06-09 NOTE — Assessment & Plan Note (Signed)
 Effective. Requesting refills.

## 2024-07-21 ENCOUNTER — Other Ambulatory Visit: Payer: Self-pay

## 2024-07-21 ENCOUNTER — Ambulatory Visit: Attending: Nurse Practitioner

## 2024-07-21 DIAGNOSIS — I10 Essential (primary) hypertension: Secondary | ICD-10-CM

## 2024-07-21 MED ORDER — LISINOPRIL-HYDROCHLOROTHIAZIDE 20-25 MG PO TABS: 1.0000 | ORAL_TABLET | Freq: Every day | ORAL | 3 refills | Status: DC

## 2024-07-29 ENCOUNTER — Ambulatory Visit: Admitting: Nurse Practitioner

## 2024-09-16 ENCOUNTER — Encounter: Payer: Self-pay | Admitting: Oncology

## 2024-09-22 ENCOUNTER — Other Ambulatory Visit: Payer: Self-pay | Admitting: Urology

## 2024-09-22 ENCOUNTER — Other Ambulatory Visit: Payer: Self-pay | Admitting: Nurse Practitioner

## 2024-09-22 DIAGNOSIS — E349 Endocrine disorder, unspecified: Secondary | ICD-10-CM

## 2024-09-22 DIAGNOSIS — I10 Essential (primary) hypertension: Secondary | ICD-10-CM

## 2024-09-23 ENCOUNTER — Ambulatory Visit: Admitting: Urology

## 2024-09-24 ENCOUNTER — Telehealth: Payer: Self-pay

## 2024-09-24 NOTE — Telephone Encounter (Signed)
 Pt LM on triage line requesting assistance in getting his T refilled.   Called [facility] [address]- s/w pharmacist. Rx is ready for p/u - 50.85 is what pt owes.   LM on pts cell with the above information.

## 2024-09-26 NOTE — Progress Notes (Unsigned)
 "  09/27/24 3:22 PM   Mark Parsons. 1966/01/03 [id number]  Referring provider:  Gretel App, NP [address],  [place] [address]  Urological history  1.  Hypogonadism -contributing factors of age, hyperglycemia, obesity, chronic pain medication and alcohol abuse. -testosterone  level  pending -hemoglobin/hematocrit  pending -managed with testosterone  topical gel 1.62%, 2 pumps Sunday, Tuesday, Thursday and Saturday and 3 pumps on Monday, Wednesday and Fridays    2. Erythrocytosis -managed with periodic phlebotomy   3. BPH with LU TS -PSA pending  -cysto, 02/2022 -  Mild lateral lobe enlargement  prostate-nonocclusive -TRUS, 02/2022 - 28 grams  -contrast CT (04/2022) NED   4. ED -contributory factors of age, BPH, testosterone  deficiency, Peyronie's disease, alcohol abuse, smoking, antidepressants and pain medication  -tadalafil  20 mg, on-demand-dosing   5. Peyronie's disease -underwent interferon injections ~ 10 years ago   Chief Complaint  Patient presents with   Hypogonadism in male    HPI: Mark Parsons. is a 59 y.o.male who returns today 6 month follow up.  Previous records reviewed.     He reports fair adherence to testosterone  gel 1.62%.  Denies new complaints of low libido, erectile dysfunction, fatigue, or mood changes.  No complaints of gynecomastia, visual changes, or thromboembolic symptoms.  Energy level, libido and overall sense of wellbeing being reported as stable/ improved compared to prior visit.    Testosterone  level pending   Hemoglobin/hematocrit pending   I PSS 6/2  He reports sensation of incomplete bladder emptying, urinary frequency, urinary urgency, and nocturia x 3.  Patient denies any modifying or aggravating factors.  Patient denies any recent UTI's, gross hematuria, dysuria or suprapubic/flank pain.  Patient denies any fevers, chills, nausea or vomiting.    PSA pending   Serum creatinine (10/2023)  0.97  Hemoglobin A1c (10/2023) 5.9  Diuretics: hydrochlorothiazide    BPH meds: tamsulosin  0.4 mg daily   SHIM 5  He is not currently sexually active, but he states that the Cialis  is effective when he takes the medication.    Cholesterol (10/2023) elevated total cholesterol, elevated triglycerides, elevated VLDL and elevated LDL  TSH (10/2023) 3.53   ED meds:  tadalafil  20 mg, on-demand-dosing  PMH: Past Medical History:  Diagnosis Date   Depression    Hypertension    Low testosterone  in male     Surgical History: No past surgical history on file.  Home Medications:  Allergies as of 09/27/2024       Reactions   Propofol Anaphylaxis   Near death        Medication List        Accurate as of September 27, 2024  3:22 PM. If you have any questions, ask your nurse or doctor.          STOP taking these medications    clotrimazole -betamethasone  cream Commonly known as: LOTRISONE  Stopped by: [name]       TAKE these medications    amLODipine  5 MG tablet Commonly known as: NORVASC  Take 1 tablet by mouth once daily   FLUoxetine  40 MG capsule Commonly known as: PROZAC  Take 1 capsule (40 mg total) by mouth daily.   ketoconazole  2 % cream Commonly known as: NIZORAL  Apply 1 Application topically daily. X 2-6 weeks.   lisinopril -hydrochlorothiazide  20-25 MG tablet Commonly known as: ZESTORETIC  Take 1 tablet by mouth daily.   rosuvastatin  20 MG tablet Commonly known as: Crestor  Take 1 tablet (20 mg total) by mouth daily.  tadalafil  20 MG tablet Commonly known as: CIALIS  Take 1 tablet (20 mg total) by mouth daily as needed for erectile dysfunction.   Testosterone  1.62 % Gel APPLY TWO PUMPS TOPICALLY ON SUNDAY, TUESDAY, THURSDAY, AND SATURDAY. APPLY THREE PUMPS TOPICALLY ON MONDAY, WEDNESDAY, AND FRIDAY        Allergies:  Allergies  Allergen Reactions   Propofol Anaphylaxis    Near death    Family History: Family History  Problem  Relation Age of Onset   Colon cancer Mother    Multiple myeloma Father    Aortic aneurysm Father    Stroke Father     Social History:  reports that he has never smoked. He has never been exposed to tobacco smoke. He has never used smokeless tobacco. He reports current alcohol use of about 40.0 standard drinks of alcohol per week. He reports that he does not use drugs.   Physical Exam: BP (!) 143/93   Pulse 94   Wt 200 lb (90.7 kg)   SpO2 95%   BMI 28.70 kg/m   Constitutional:  Well nourished. Alert and oriented, No acute distress. HEENT: [place] AT, moist mucus membranes.  Trachea midline Cardiovascular: No clubbing, cyanosis, or edema. Respiratory: Normal respiratory effort, no increased work of breathing. Neurologic: Grossly intact, no focal deficits, moving all 4 extremities. Psychiatric: Normal mood and affect.   Laboratory Data: See EPIC and HPI I have reviewed the labs.   Pertinent Imaging: N/A   Assessment & Plan:    1. Hypogonadism  -testosterone  level pending  -Hemoglobin/hematocrit level pending -continue 2 pumps on Sunday, Tuesday, Thursday and Saturday, and 3 pumps on M, W, F  2. BPH with LUTS -PSA pending  -cysto negative for BOO -he states his LU TS has improved  3. ED -continue tadalafil  20 mg, on-demand-dosing   Return in about 6 months (around 03/27/2025) for PSA, testosterone , hemoglobin, hematocrit, I PSS, SHIM .  [name]   [facility] [address], [address] [place], [place] [address] [phone]  "

## 2024-09-27 ENCOUNTER — Encounter: Payer: Self-pay | Admitting: Oncology

## 2024-09-27 ENCOUNTER — Ambulatory Visit: Admitting: Urology

## 2024-09-27 ENCOUNTER — Encounter: Payer: Self-pay | Admitting: Urology

## 2024-09-27 VITALS — BP 143/93 | HR 94 | Wt 200.0 lb

## 2024-09-27 DIAGNOSIS — N138 Other obstructive and reflux uropathy: Secondary | ICD-10-CM | POA: Diagnosis not present

## 2024-09-27 DIAGNOSIS — N401 Enlarged prostate with lower urinary tract symptoms: Secondary | ICD-10-CM

## 2024-09-27 DIAGNOSIS — E349 Endocrine disorder, unspecified: Secondary | ICD-10-CM

## 2024-09-27 DIAGNOSIS — N529 Male erectile dysfunction, unspecified: Secondary | ICD-10-CM | POA: Diagnosis not present

## 2024-09-27 DIAGNOSIS — E291 Testicular hypofunction: Secondary | ICD-10-CM | POA: Diagnosis not present

## 2024-09-28 LAB — TESTOSTERONE: Testosterone: 485 ng/dL (ref 264–916)

## 2024-09-28 LAB — PSA: Prostate Specific Ag, Serum: 0.8 ng/mL (ref 0.0–4.0)

## 2024-09-28 LAB — HEMOGLOBIN AND HEMATOCRIT, BLOOD
Hematocrit: 45.5 % (ref 37.5–51.0)
Hemoglobin: 15 g/dL (ref 13.0–17.7)

## 2024-09-30 ENCOUNTER — Encounter: Payer: Self-pay | Admitting: Nurse Practitioner

## 2024-09-30 ENCOUNTER — Ambulatory Visit: Admitting: Nurse Practitioner

## 2024-09-30 VITALS — BP 140/100 | HR 96 | Temp 98.7°F | Ht 70.0 in | Wt 212.2 lb

## 2024-09-30 DIAGNOSIS — E785 Hyperlipidemia, unspecified: Secondary | ICD-10-CM

## 2024-09-30 DIAGNOSIS — I10 Essential (primary) hypertension: Secondary | ICD-10-CM

## 2024-09-30 MED ORDER — LISINOPRIL-HYDROCHLOROTHIAZIDE 20-12.5 MG PO TABS: 2.0000 | ORAL_TABLET | Freq: Every day | ORAL | 3 refills | Status: AC

## 2024-09-30 NOTE — Assessment & Plan Note (Signed)
 Currently on Crestor  20 mg daily with no reported side effects. Continue Crestor .

## 2024-09-30 NOTE — Progress Notes (Signed)
 " Mark Glance, NP-C Phone: [phone]  Mark Parsons. is a 59 y.o. male who presents today for follow up.   Discussed the use of AI scribe software for clinical note transcription with the patient, who gave verbal consent to proceed.  History of Present Illness   Mark Parsons. is a 59 year old male with hypertension who presents with elevated blood pressure.  His blood pressure was recorded at 168/108 mmHg during a recent visit. He attributes the elevated readings to stress related to his father's health issues and impending weather conditions. His father, who is 46 years old, has cancer, heart failure, and kidney problems, which adds to his stress.  He is currently taking amlodipine  and a combination pill of lisinopril  and hydrochlorothiazide . He recently refilled his prescription for the combination pill, which contains lisinopril  20 mg and hydrochlorothiazide  25 mg. He takes one pill daily and notes that his blood pressure has generally been lower with this regimen.  He discusses his use of testosterone  therapy, noting that it has helped him feel better but is concerned it may be contributing to his elevated blood pressure. He has considered reducing his dose, as he feels his current levels may be unnecessarily high.  No chest pain, shortness of breath, or dizziness. He feels generally well, despite gaining a couple of pounds recently. He mentions that he used to check his blood pressure at home but needs a new cuff to resume this practice.  Socially, he lives close to town and is concerned about potential power outages due to the weather, especially in relation to caring for his father.      Tobacco Use History[1]  Medications Ordered Prior to Encounter[2]   ROS see history of present illness  Objective  Physical Exam Vitals:   09/30/24 1312 09/30/24 1338  BP: (!) 168/108 (!) 140/100  Pulse: 96   Temp: 98.7 F (37.1 C)   SpO2: 98%     BP Readings from Last 3  Encounters:  09/30/24 (!) 140/100  09/27/24 (!) 143/93  06/09/24 (!) 140/98   Wt Readings from Last 3 Encounters:  09/30/24 212 lb 3.2 oz (96.3 kg)  09/27/24 200 lb (90.7 kg)  06/09/24 206 lb 3.2 oz (93.5 kg)    Physical Exam Constitutional:      General: He is not in acute distress.    Appearance: Normal appearance.  HENT:     Head: Normocephalic.  Cardiovascular:     Rate and Rhythm: Normal rate and regular rhythm.     Heart sounds: Normal heart sounds.  Pulmonary:     Effort: Pulmonary effort is normal.     Breath sounds: Normal breath sounds.  Skin:    General: Skin is warm and dry.  Neurological:     General: No focal deficit present.     Mental Status: He is alert.  Psychiatric:        Mood and Affect: Mood normal.        Behavior: Behavior normal.      Assessment/Plan: Please see individual problem list.  Benign essential hypertension Assessment & Plan: Blood pressure remains elevated, uncontrolled. He is currently on amlodipine , lisinopril , and hydrochlorothiazide . Testosterone  therapy may be contributing to the elevated blood pressure. He reports no chest pain, shortness of breath, or dizziness, and his weight is stable. Adjust lisinopril /hydrochlorothiazide  to 20/12.5 mg, instructing him to take two tablets daily to achieve a 40/25 mg regimen. Blood pressure was rechecked during the visit. Continue amlodipine  as  prescribed. Monitor blood pressure at home with a new cuff. We will continue to monitor.   Orders: -     Lisinopril -hydroCHLOROthiazide ; Take 2 tablets by mouth daily.  Dispense: 180 tablet; Refill: 3 -     Comprehensive metabolic panel with GFR  Hyperlipidemia, unspecified hyperlipidemia type Assessment & Plan: Currently on Crestor  20 mg daily with no reported side effects. Continue Crestor .   Orders: -     Comprehensive metabolic panel with GFR      Return in about 3 months (around 12/29/2024) for Follow up.   Mark Glance, NP-C [facility]    [1]  Social History Tobacco Use  Smoking Status Never   Passive exposure: Never  Smokeless Tobacco Never  [2]  Current Outpatient Medications on File Prior to Visit  Medication Sig Dispense Refill   amLODipine  (NORVASC ) 5 MG tablet Take 1 tablet by mouth once daily 90 tablet 0   FLUoxetine  (PROZAC ) 40 MG capsule Take 1 capsule (40 mg total) by mouth daily. 90 capsule 3   ketoconazole  (NIZORAL ) 2 % cream Apply 1 Application topically daily. X 2-6 weeks. 60 g 2   rosuvastatin  (CRESTOR ) 20 MG tablet Take 1 tablet (20 mg total) by mouth daily. 90 tablet 3   tadalafil  (CIALIS ) 20 MG tablet Take 1 tablet (20 mg total) by mouth daily as needed for erectile dysfunction. 30 tablet 3   Testosterone  1.62 % GEL APPLY TWO PUMPS TOPICALLY ON SUNDAY, TUESDAY, THURSDAY, AND SATURDAY. APPLY THREE PUMPS TOPICALLY ON MONDAY, WEDNESDAY, AND FRIDAY 75 g 0   No current facility-administered medications on file prior to visit.   "

## 2024-09-30 NOTE — Assessment & Plan Note (Addendum)
 Blood pressure remains elevated, uncontrolled. He is currently on amlodipine , lisinopril , and hydrochlorothiazide . Testosterone  therapy may be contributing to the elevated blood pressure. He reports no chest pain, shortness of breath, or dizziness, and his weight is stable. Adjust lisinopril /hydrochlorothiazide  to 20/12.5 mg, instructing him to take two tablets daily to achieve a 40/25 mg regimen. Blood pressure was rechecked during the visit. Continue amlodipine  as prescribed. Monitor blood pressure at home with a new cuff. We will continue to monitor.

## 2024-10-01 ENCOUNTER — Ambulatory Visit: Payer: Self-pay | Admitting: Nurse Practitioner

## 2024-10-01 LAB — COMPREHENSIVE METABOLIC PANEL WITH GFR
ALT: 25 U/L (ref 3–53)
AST: 20 U/L (ref 5–37)
Albumin: 4.5 g/dL (ref 3.5–5.2)
Alkaline Phosphatase: 51 U/L (ref 39–117)
BUN: 17 mg/dL (ref 6–23)
CO2: 26 meq/L (ref 19–32)
Calcium: 9.2 mg/dL (ref 8.4–10.5)
Chloride: 101 meq/L (ref 96–112)
Creatinine, Ser: 0.99 mg/dL (ref 0.40–1.50)
GFR: 84.58 mL/min
Glucose, Bld: 97 mg/dL (ref 70–99)
Potassium: 3.5 meq/L (ref 3.5–5.1)
Sodium: 136 meq/L (ref 135–145)
Total Bilirubin: 0.4 mg/dL (ref 0.2–1.2)
Total Protein: 7.3 g/dL (ref 6.0–8.3)

## 2024-10-11 NOTE — Progress Notes (Signed)
 Mark Parsons.                                          MRN: [id number]   10/11/2024   The [facility] Quality Team Specialist reviewed this patient medical record for the purposes of chart review for care gap closure. The following were reviewed: chart review for care gap closure-controlling blood pressure.    [facility] Quality Team

## 2024-12-29 ENCOUNTER — Ambulatory Visit: Admitting: Nurse Practitioner

## 2025-03-28 ENCOUNTER — Ambulatory Visit: Admitting: Urology
